# Patient Record
Sex: Female | Born: 1945 | Race: White | Hispanic: No | Smoking: Former smoker
Health system: Southern US, Community
[De-identification: ages and names within clinical notes are randomized; demographics above are authoritative.]

## PROBLEM LIST (undated history)

## (undated) DIAGNOSIS — E119 Type 2 diabetes mellitus without complications: Secondary | ICD-10-CM

## (undated) DIAGNOSIS — K219 Gastro-esophageal reflux disease without esophagitis: Secondary | ICD-10-CM

## (undated) DIAGNOSIS — J449 Chronic obstructive pulmonary disease, unspecified: Secondary | ICD-10-CM

## (undated) DIAGNOSIS — IMO0001 Reserved for inherently not codable concepts without codable children: Secondary | ICD-10-CM

## (undated) DIAGNOSIS — I509 Heart failure, unspecified: Secondary | ICD-10-CM

## (undated) DIAGNOSIS — I639 Cerebral infarction, unspecified: Secondary | ICD-10-CM

## (undated) HISTORY — PX: TONSILLECTOMY: SUR1361

## (undated) HISTORY — PX: NASAL SINUS SURGERY: SHX719

## (undated) HISTORY — PX: BACK SURGERY: SHX140

---

## 2015-12-08 ENCOUNTER — Emergency Department: Payer: Medicare Other

## 2015-12-08 ENCOUNTER — Encounter: Payer: Self-pay | Admitting: Emergency Medicine

## 2015-12-08 ENCOUNTER — Emergency Department
Admission: EM | Admit: 2015-12-08 | Discharge: 2015-12-08 | Disposition: A | Discharge status: Home / Self Care | Payer: Medicare Other | Attending: Emergency Medicine | Admitting: Emergency Medicine

## 2015-12-08 DIAGNOSIS — Y9301 Activity, walking, marching and hiking: Secondary | ICD-10-CM | POA: Diagnosis not present

## 2015-12-08 DIAGNOSIS — Y9289 Other specified places as the place of occurrence of the external cause: Secondary | ICD-10-CM | POA: Diagnosis not present

## 2015-12-08 DIAGNOSIS — W010XXA Fall on same level from slipping, tripping and stumbling without subsequent striking against object, initial encounter: Secondary | ICD-10-CM | POA: Diagnosis not present

## 2015-12-08 DIAGNOSIS — S199XXA Unspecified injury of neck, initial encounter: Secondary | ICD-10-CM | POA: Diagnosis present

## 2015-12-08 DIAGNOSIS — Z880 Allergy status to penicillin: Secondary | ICD-10-CM | POA: Insufficient documentation

## 2015-12-08 DIAGNOSIS — E119 Type 2 diabetes mellitus without complications: Secondary | ICD-10-CM | POA: Insufficient documentation

## 2015-12-08 DIAGNOSIS — S335XXA Sprain of ligaments of lumbar spine, initial encounter: Secondary | ICD-10-CM | POA: Insufficient documentation

## 2015-12-08 DIAGNOSIS — F17200 Nicotine dependence, unspecified, uncomplicated: Secondary | ICD-10-CM | POA: Diagnosis not present

## 2015-12-08 DIAGNOSIS — S139XXA Sprain of joints and ligaments of unspecified parts of neck, initial encounter: Secondary | ICD-10-CM | POA: Diagnosis not present

## 2015-12-08 DIAGNOSIS — Y998 Other external cause status: Secondary | ICD-10-CM | POA: Diagnosis not present

## 2015-12-08 DIAGNOSIS — J441 Chronic obstructive pulmonary disease with (acute) exacerbation: Secondary | ICD-10-CM | POA: Insufficient documentation

## 2015-12-08 HISTORY — DX: Gastro-esophageal reflux disease without esophagitis: K21.9

## 2015-12-08 HISTORY — DX: Chronic obstructive pulmonary disease, unspecified: J44.9

## 2015-12-08 HISTORY — DX: Reserved for inherently not codable concepts without codable children: IMO0001

## 2015-12-08 HISTORY — DX: Type 2 diabetes mellitus without complications: E11.9

## 2015-12-08 HISTORY — DX: Cerebral infarction, unspecified: I63.9

## 2015-12-08 MED ORDER — IPRATROPIUM-ALBUTEROL 0.5-2.5 (3) MG/3ML IN SOLN
3.0000 mL | Freq: Once | RESPIRATORY_TRACT | Status: AC
  Administered 2015-12-08: 3 mL via RESPIRATORY_TRACT

## 2015-12-08 MED ORDER — IBUPROFEN 400 MG PO TABS: 400.0000 mg | ORAL_TABLET | Freq: Three times a day (TID) | ORAL | Status: DC | PRN

## 2015-12-08 MED ORDER — IPRATROPIUM-ALBUTEROL 0.5-2.5 (3) MG/3ML IN SOLN
RESPIRATORY_TRACT | Status: AC
  Filled 2015-12-08: qty 3

## 2015-12-08 NOTE — ED Notes (Signed)
Pt from [facility]

## 2015-12-08 NOTE — ED Notes (Signed)
Patient transported to CT 

## 2015-12-08 NOTE — ED Notes (Signed)
Pt had mechanical fall yesterday.  Was walking and had slipped on hand rail and fell on tailbone. Pt denies pain yesterday but today has had low back pain and neck pain. Hx back surgery.  Denies weakness or feeling like going to pass out. No LOC with fall. Pt remembers event.

## 2015-12-08 NOTE — ED Notes (Signed)
Returned from radiology. 

## 2015-12-08 NOTE — ED Provider Notes (Signed)
[facility] Emergency Department Provider Note  ____________________________________________    I have reviewed the triage vital signs and the nursing notes.   HISTORY  Chief Complaint Fall    HPI Kristen Cross is a 70 y.o. female who presents after a mechanical fall. Patient reports the fall was yesterday she reports she fell onto her backside. She denies dizziness or chest pain or palpitations. She was feeling well after the fall but today she has soreness in her lower back and neck. No neuro deficits. No abdominal pain, no hip pain or pelvic pain     Past Medical History  Diagnosis Date  . Stroke (HCC)   . COPD (chronic obstructive pulmonary disease) (HCC)   . Diabetes mellitus without complication (HCC)   . Reflux     There are no active problems to display for this patient.   Past Surgical History  Procedure Laterality Date  . Back surgery    . Tonsillectomy    . Nasal sinus surgery      No current outpatient prescriptions on file.  Allergies Penicillins and Codeine  History reviewed. No pertinent family history.  Social History Social History  Substance Use Topics  . Smoking status: Current Every Day Smoker  . Smokeless tobacco: None  . Alcohol Use: No    Review of Systems  Constitutional: Negative for fever. Eyes: Negative for redness ENT: Negative for sore throat Cardiovascular: Negative for chest pain Respiratory: Mild cough Gastrointestinal: Negative for abdominal pain Genitourinary: Negative for dysuria. Musculoskeletal: As above Skin: Negative for rash. Neurological: Negative for focal weakness Psychiatric: no anxiety    ____________________________________________   PHYSICAL EXAM:  VITAL SIGNS: ED Triage Vitals  Enc Vitals Group     BP 12/08/15 1550 129/69 mmHg     Pulse Rate 12/08/15 1550 97     Resp 12/08/15 1550 18     Temp 12/08/15 1550 98.1 F (36.7 C)     Temp Source 12/08/15 1550 Oral     SpO2  12/08/15 1550 95 %     Weight 12/08/15 1550 192 lb (87.091 kg)     Height 12/08/15 1550 5\' 2"  (1.575 m)     Head Cir --      Peak Flow --      Pain Score 12/08/15 1551 9     Pain Loc --      Pain Edu? --      Excl. in GC? --      Constitutional: Alert and oriented. Well appearing and in no distress.  Eyes: Conjunctivae are normal. No erythema or injection ENT   Head: Normocephalic and atraumatic.   Mouth/Throat: Mucous membranes are moist. Cardiovascular: Normal rate, regular rhythm. Normal and symmetric distal pulses are present in the upper extremities.  Respiratory: Normal respiratory effort without tachypnea nor retractions. Scattered wheezes Gastrointestinal: Soft and non-tender in all quadrants. No distention. There is no CVA tenderness. Genitourinary: deferred Musculoskeletal: Nontender with normal range of motion in all extremities. No lower extremity tenderness nor edema. No vertebral tenderness to palpation, normal strength in the lower extremities and upper extremity is. No pain with hip range of motion bilaterally Neurologic:  Normal speech and language. No gross focal neurologic deficits are appreciated. Skin:  Skin is warm, dry and intact. No rash noted. Psychiatric: Mood and affect are normal. Patient exhibits appropriate insight and judgment.  ____________________________________________    LABS (pertinent positives/negatives)  Labs Reviewed - No data to display  ____________________________________________   EKG  None  ____________________________________________    RADIOLOGY  CT cervical spine is unremarkable, x-rays of the sacrum coccyx and lumbar spine show no acute abnormalities ____________________________________________   PROCEDURES  Procedure(s) performed: none  Critical Care performed: none  ____________________________________________   INITIAL IMPRESSION / ASSESSMENT AND PLAN / ED COURSE  Pertinent labs & imaging results  that were available during my care of the patient were reviewed by me and considered in my medical decision making (see chart for details).  No acute abdomen on his own imaging, exam is reassuring. Feel patient is appropriate for discharge at this time with outpatient follow-up as needed.  ____________________________________________   FINAL CLINICAL IMPRESSION(S) / ED DIAGNOSES  Final diagnoses:  Neck sprain, initial encounter  Lumbar sprain, initial encounter          Jene Every, MD 12/08/15 1945

## 2015-12-08 NOTE — ED Notes (Signed)
tammy from [facility] called and pt has no family. Can call tammy if pt needs ride [phone]. If unable to reach tammy can call janice [phone]

## 2015-12-14 ENCOUNTER — Emergency Department
Admission: EM | Admit: 2015-12-14 | Discharge: 2015-12-14 | Disposition: A | Discharge status: Skilled Nursing Facility | Payer: Medicare Other | Attending: Emergency Medicine | Admitting: Emergency Medicine

## 2015-12-14 ENCOUNTER — Emergency Department: Payer: Medicare Other

## 2015-12-14 ENCOUNTER — Encounter: Payer: Self-pay | Admitting: Intensive Care

## 2015-12-14 DIAGNOSIS — I639 Cerebral infarction, unspecified: Secondary | ICD-10-CM | POA: Diagnosis not present

## 2015-12-14 DIAGNOSIS — Z794 Long term (current) use of insulin: Secondary | ICD-10-CM | POA: Diagnosis not present

## 2015-12-14 DIAGNOSIS — Z79899 Other long term (current) drug therapy: Secondary | ICD-10-CM | POA: Insufficient documentation

## 2015-12-14 DIAGNOSIS — R0602 Shortness of breath: Secondary | ICD-10-CM | POA: Diagnosis present

## 2015-12-14 DIAGNOSIS — I509 Heart failure, unspecified: Secondary | ICD-10-CM | POA: Diagnosis not present

## 2015-12-14 DIAGNOSIS — F17200 Nicotine dependence, unspecified, uncomplicated: Secondary | ICD-10-CM | POA: Insufficient documentation

## 2015-12-14 DIAGNOSIS — J441 Chronic obstructive pulmonary disease with (acute) exacerbation: Secondary | ICD-10-CM | POA: Insufficient documentation

## 2015-12-14 DIAGNOSIS — E119 Type 2 diabetes mellitus without complications: Secondary | ICD-10-CM | POA: Insufficient documentation

## 2015-12-14 DIAGNOSIS — Z7982 Long term (current) use of aspirin: Secondary | ICD-10-CM | POA: Diagnosis not present

## 2015-12-14 HISTORY — DX: Heart failure, unspecified: I50.9

## 2015-12-14 LAB — CBC WITH DIFFERENTIAL/PLATELET
BASOS PCT: 0 %
Basophils Absolute: 0 10*3/uL (ref 0–0.1)
EOS ABS: 0 10*3/uL (ref 0–0.7)
Eosinophils Relative: 0 %
HEMATOCRIT: 33.4 % — AB (ref 35.0–47.0)
HEMOGLOBIN: 11.8 g/dL — AB (ref 12.0–16.0)
LYMPHS ABS: 0.9 10*3/uL — AB (ref 1.0–3.6)
Lymphocytes Relative: 7 %
MCH: 30.5 pg (ref 26.0–34.0)
MCHC: 35.3 g/dL (ref 32.0–36.0)
MCV: 86.4 fL (ref 80.0–100.0)
MONOS PCT: 6 %
Monocytes Absolute: 0.8 10*3/uL (ref 0.2–0.9)
NEUTROS ABS: 12.3 10*3/uL — AB (ref 1.4–6.5)
NEUTROS PCT: 87 %
Platelets: 217 10*3/uL (ref 150–440)
RBC: 3.86 MIL/uL (ref 3.80–5.20)
RDW: 13 % (ref 11.5–14.5)
WBC: 14.1 10*3/uL — AB (ref 3.6–11.0)

## 2015-12-14 LAB — BASIC METABOLIC PANEL
Anion gap: 8 (ref 5–15)
BUN: 14 mg/dL (ref 6–20)
CALCIUM: 8.7 mg/dL — AB (ref 8.9–10.3)
CHLORIDE: 100 mmol/L — AB (ref 101–111)
CO2: 29 mmol/L (ref 22–32)
CREATININE: 0.69 mg/dL (ref 0.44–1.00)
Glucose, Bld: 120 mg/dL — ABNORMAL HIGH (ref 65–99)
POTASSIUM: 2.7 mmol/L — AB (ref 3.5–5.1)
SODIUM: 137 mmol/L (ref 135–145)

## 2015-12-14 LAB — BRAIN NATRIURETIC PEPTIDE: B Natriuretic Peptide: 116 pg/mL — ABNORMAL HIGH (ref 0.0–100.0)

## 2015-12-14 LAB — TROPONIN I

## 2015-12-14 MED ORDER — POTASSIUM CHLORIDE CRYS ER 20 MEQ PO TBCR
40.0000 meq | EXTENDED_RELEASE_TABLET | Freq: Once | ORAL | Status: AC
  Administered 2015-12-14: 40 meq via ORAL
  Filled 2015-12-14: qty 2

## 2015-12-14 MED ORDER — IPRATROPIUM-ALBUTEROL 0.5-2.5 (3) MG/3ML IN SOLN
3.0000 mL | Freq: Once | RESPIRATORY_TRACT | Status: AC
  Administered 2015-12-14: 3 mL via RESPIRATORY_TRACT
  Filled 2015-12-14: qty 3

## 2015-12-14 MED ORDER — LEVOFLOXACIN 750 MG PO TABS
750.0000 mg | ORAL_TABLET | Freq: Once | ORAL | Status: AC
  Administered 2015-12-14: 750 mg via ORAL
  Filled 2015-12-14: qty 1

## 2015-12-14 MED ORDER — METHYLPREDNISOLONE SODIUM SUCC 125 MG IJ SOLR
125.0000 mg | Freq: Once | INTRAMUSCULAR | Status: AC
  Administered 2015-12-14: 125 mg via INTRAVENOUS
  Filled 2015-12-14: qty 2

## 2015-12-14 MED ORDER — LEVOFLOXACIN 500 MG PO TABS: 500.0000 mg | ORAL_TABLET | Freq: Every day | ORAL | Status: AC

## 2015-12-14 NOTE — ED Notes (Signed)
Spoke with [facility] in [place] about patient being discharged and being picked up. Worker stated they cannot pick up patient due to it being grocery day. It was explained to [facility] that EMS will not come pick up patient that is not bed bound. [facility] relayed to secretary they will be calling back about picking up patient

## 2015-12-14 NOTE — ED Notes (Signed)
Pt with increasing shortness of breath.  EMS was called to [name] where the patient is a resident.  EMS reports that SpO2 was 87% on RA [facility]  EMS placed CPAP on patient and O2 sat on CPAP was 97%.  On arrival to ED, Pt was on 4LO2 via n/c with O2 sats 98%.  EMS reports pt received nebulizers x2 at facility.

## 2015-12-14 NOTE — Discharge Instructions (Signed)
Chronic Obstructive Pulmonary Disease Chronic obstructive pulmonary disease (COPD) is a common lung condition in which airflow from the lungs is limited. COPD is a general term that can be used to describe many different lung problems that limit airflow, including both chronic bronchitis and emphysema. If you have COPD, your lung function will probably never return to normal, but there are measures you can take to improve lung function and make yourself feel better. CAUSES   Smoking (common).  Exposure to secondhand smoke.  Genetic problems.  Chronic inflammatory lung diseases or recurrent infections. SYMPTOMS  Shortness of breath, especially with physical activity.  Deep, persistent (chronic) cough with a large amount of thick mucus.  Wheezing.  Rapid breaths (tachypnea).  Gray or bluish discoloration (cyanosis) of the skin, especially in your fingers, toes, or lips.  Fatigue.  Weight loss.  Frequent infections or episodes when breathing symptoms become much worse (exacerbations).  Chest tightness. DIAGNOSIS Your health care provider will take a medical history and perform a physical examination to diagnose COPD. Additional tests for COPD may include:  Lung (pulmonary) function tests.  Chest X-ray.  CT scan.  Blood tests. TREATMENT  Treatment for COPD may include:  Inhaler and nebulizer medicines. These help manage the symptoms of COPD and make your breathing more comfortable.  Supplemental oxygen. Supplemental oxygen is only helpful if you have a low oxygen level in your blood.  Exercise and physical activity. These are beneficial for nearly all people with COPD.  Lung surgery or transplant.  Nutrition therapy to gain weight, if you are underweight.  Pulmonary rehabilitation. This may involve working with a team of health care providers and specialists, such as respiratory, occupational, and physical therapists. HOME CARE INSTRUCTIONS  Take all medicines  (inhaled or pills) as directed by your health care provider.  Avoid over-the-counter medicines or cough syrups that dry up your airway (such as antihistamines) and slow down the elimination of secretions unless instructed otherwise by your health care provider.  If you are a smoker, the most important thing that you can do is stop smoking. Continuing to smoke will cause further lung damage and breathing trouble. Ask your health care provider for help with quitting smoking. He or she can direct you to community resources or hospitals that provide support.  Avoid exposure to irritants such as smoke, chemicals, and fumes that aggravate your breathing.  Use oxygen therapy and pulmonary rehabilitation if directed by your health care provider. If you require home oxygen therapy, ask your health care provider whether you should purchase a pulse oximeter to measure your oxygen level at home.  Avoid contact with individuals who have a contagious illness.  Avoid extreme temperature and humidity changes.  Eat healthy foods. Eating smaller, more frequent meals and resting before meals may help you maintain your strength.  Stay active, but balance activity with periods of rest. Exercise and physical activity will help you maintain your ability to do things you want to do.  Preventing infection and hospitalization is very important when you have COPD. Make sure to receive all the vaccines your health care provider recommends, especially the pneumococcal and influenza vaccines. Ask your health care provider whether you need a pneumonia vaccine.  Learn and use relaxation techniques to manage stress.  Learn and use controlled breathing techniques as directed by your health care provider. Controlled breathing techniques include:  Pursed lip breathing. Start by breathing in (inhaling) through your nose for 1 second. Then, purse your lips as if you were   going to whistle and breathe out (exhale) through the  pursed lips for 2 seconds.  Diaphragmatic breathing. Start by putting one hand on your abdomen just above your waist. Inhale slowly through your nose. The hand on your abdomen should move out. Then purse your lips and exhale slowly. You should be able to feel the hand on your abdomen moving in as you exhale.  Learn and use controlled coughing to clear mucus from your lungs. Controlled coughing is a series of short, progressive coughs. The steps of controlled coughing are: 1. Lean your head slightly forward. 2. Breathe in deeply using diaphragmatic breathing. 3. Try to hold your breath for 3 seconds. 4. Keep your mouth slightly open while coughing twice. 5. Spit any mucus out into a tissue. 6. Rest and repeat the steps once or twice as needed. SEEK MEDICAL CARE IF:  You are coughing up more mucus than usual.  There is a change in the color or thickness of your mucus.  Your breathing is more labored than usual.  Your breathing is faster than usual. SEEK IMMEDIATE MEDICAL CARE IF:  You have shortness of breath while you are resting.  You have shortness of breath that prevents you from:  Being able to talk.  Performing your usual physical activities.  You have chest pain lasting longer than 5 minutes.  Your skin color is more cyanotic than usual.  You measure low oxygen saturations for longer than 5 minutes with a pulse oximeter. MAKE SURE YOU:  Understand these instructions.  Will watch your condition.  Will get help right away if you are not doing well or get worse.   This information is not intended to replace advice given to you by your health care provider. Make sure you discuss any questions you have with your health care provider.   Document Released: 06/06/2005 Document Revised: 09/17/2014 Document Reviewed: 04/23/2013 Elsevier Interactive Patient Education 2016 [employer]  

## 2015-12-14 NOTE — ED Notes (Signed)
Upon entering room pt appeared to be uncomfortable. When asked what was wrong pt stated "I feel better I just wish I could leave" asked if there was anything I could do to assist her to feel more comfortable pt stated "not really". Art therapistAmber RN notified about this.

## 2015-12-14 NOTE — ED Notes (Signed)
[facility] reported they will be here in a couple of hours to pick up patient. Patient is waiting in [facility] for ride

## 2015-12-14 NOTE — ED Provider Notes (Signed)
[facility] Emergency Department Provider Note     Time seen: ----------------------------------------- 11:00 AM on 12/14/2015 -----------------------------------------  L5 caveat: Review of systems and history is limited by dyspnea, CPAP   I have reviewed the triage vital signs and the nursing notes.   HISTORY  Chief Complaint No chief complaint on file.    HPI Kristen Cross is a 70 y.o. female who presents the ER being brought in by EMS for shortness of breath. She had significant difficulty breathing at her nursing home, was placed on CPAP and brought in for further evaluation. Patient feels like the oxygen as helped some. Review of systems and history is limited by shortness of breath   Past Medical History  Diagnosis Date  . Stroke (HCC)   . COPD (chronic obstructive pulmonary disease) (HCC)   . Diabetes mellitus without complication (HCC)   . Reflux     There are no active problems to display for this patient.   Past Surgical History  Procedure Laterality Date  . Back surgery    . Tonsillectomy    . Nasal sinus surgery      Allergies Penicillins and Codeine  Social History Social History  Substance Use Topics  . Smoking status: Current Every Day Smoker  . Smokeless tobacco: Not on file  . Alcohol Use: No    Review of Systems Constitutional: Negative for fever. Cardiovascular: Positive for shortness of breath and cough Respiratory: Negative for shortness of breath. Gastrointestinal: Negative for abdominal pain, vomiting and diarrhea.  Review of systems is otherwise negative or unknown at this time ____________________________________________   PHYSICAL EXAM:  VITAL SIGNS: ED Triage Vitals  Enc Vitals Group     BP --      Pulse --      Resp --      Temp --      Temp src --      SpO2 --      Weight --      Height --      Head Cir --      Peak Flow --      Pain Score --      Pain Loc --      Pain Edu? --    Excl. in GC? --     Constitutional: Alert and oriented. Mild to moderate distress Eyes: Conjunctivae are normal. PERRL. Normal extraocular movements. ENT   Head: Normocephalic and atraumatic.   Nose: No congestion/rhinnorhea.   Mouth/Throat: Mucous membranes are moist.   Neck: No stridor. Cardiovascular: Rapid rate, regular rhythm. Normal and symmetric distal pulses are present in all extremities. No murmurs, rubs, or gallops. Respiratory: Tachypnea with bilateral rhonchi and bibasilar rales Gastrointestinal: Soft and nontender. No distention. No abdominal bruits.  Musculoskeletal: Nontender with normal range of motion in all extremities. Lower extremity edema, left greater than right Neurologic:  Normal speech and language. No gross focal neurologic deficits are appreciated.  Skin:  Skin is warm, dry and intact. Scattered contusions are noted on her legs Psychiatric: Mood and affect are normal. Speech and behavior are normal. Patient exhibits appropriate insight and judgment. ____________________________________________  EKG: Interpreted by me. Normal sinus rhythm with rate 89 bpm, normal PR interval, normal QRS, normal QT interval. Leftward axis.  ____________________________________________  ED COURSE:  Pertinent labs & imaging results that were available during my care of the patient were reviewed by me and considered in my medical decision making (see chart for details). Patient presents with acute restaurant distress,  likely COPD with associated CHF. We will attempt to wean her off CPAP, given 2 nebs, steroids and reevaluate. ____________________________________________    LABS (pertinent positives/negatives)  Labs Reviewed  CBC WITH DIFFERENTIAL/PLATELET - Abnormal; Notable for the following:    WBC 14.1 (*)    Hemoglobin 11.8 (*)    HCT 33.4 (*)    Neutro Abs 12.3 (*)    Lymphs Abs 0.9 (*)    All other components within normal limits  BASIC METABOLIC PANEL  - Abnormal; Notable for the following:    Potassium 2.7 (*)    Chloride 100 (*)    Glucose, Bld 120 (*)    Calcium 8.7 (*)    All other components within normal limits  BRAIN NATRIURETIC PEPTIDE - Abnormal; Notable for the following:    B Natriuretic Peptide 116.0 (*)    All other components within normal limits  TROPONIN I   CRITICAL CARE Performed by: Emily FilbertWilliams, Lawana Hartzell E   Total critical care time: 30 minutes  Critical care time was exclusive of separately billable procedures and treating other patients.  Critical care was necessary to treat or prevent imminent or life-threatening deterioration.  Critical care was time spent personally by me on the following activities: development of treatment plan with patient and/or surrogate as well as nursing, discussions with consultants, evaluation of patient's response to treatment, examination of patient, obtaining history from patient or surrogate, ordering and performing treatments and interventions, ordering and review of laboratory studies, ordering and review of radiographic studies, pulse oximetry and re-evaluation of patient's condition.  RADIOLOGY  Chest x-ray Is unremarkable ____________________________________________  FINAL ASSESSMENT AND PLAN  COPD exacerbation, mild hypokalemia  Plan: Patient with labs and imaging as dictated above. Patient is improved, this is likely COPD exacerbation. There does not appear to be a significant CHF component. Patient's room air oxygen saturation saturations are 92%. She states she feels well enough to go home. She has been on steroids as prescribed by her doctor which is likely the reason she had an elevated white blood cell count. I will add Levaquin. She is stable for outpatient follow-up. Emily FilbertWilliams, Lalitha Ilyas E, MD   Emily FilbertJonathan E Royalty Fakhouri, MD 12/14/15 1255

## 2016-02-10 ENCOUNTER — Encounter: Payer: Self-pay | Admitting: Emergency Medicine

## 2016-02-10 ENCOUNTER — Emergency Department: Payer: Medicare Other

## 2016-02-10 ENCOUNTER — Emergency Department
Admission: EM | Admit: 2016-02-10 | Discharge: 2016-02-11 | Disposition: A | Discharge status: Home / Self Care | Payer: Medicare Other | Attending: Emergency Medicine | Admitting: Emergency Medicine

## 2016-02-10 DIAGNOSIS — Z8673 Personal history of transient ischemic attack (TIA), and cerebral infarction without residual deficits: Secondary | ICD-10-CM | POA: Diagnosis not present

## 2016-02-10 DIAGNOSIS — Z794 Long term (current) use of insulin: Secondary | ICD-10-CM | POA: Diagnosis not present

## 2016-02-10 DIAGNOSIS — F315 Bipolar disorder, current episode depressed, severe, with psychotic features: Secondary | ICD-10-CM | POA: Diagnosis not present

## 2016-02-10 DIAGNOSIS — E119 Type 2 diabetes mellitus without complications: Secondary | ICD-10-CM

## 2016-02-10 DIAGNOSIS — F3130 Bipolar disorder, current episode depressed, mild or moderate severity, unspecified: Secondary | ICD-10-CM

## 2016-02-10 DIAGNOSIS — J449 Chronic obstructive pulmonary disease, unspecified: Secondary | ICD-10-CM

## 2016-02-10 DIAGNOSIS — Z7982 Long term (current) use of aspirin: Secondary | ICD-10-CM | POA: Diagnosis not present

## 2016-02-10 DIAGNOSIS — Z87891 Personal history of nicotine dependence: Secondary | ICD-10-CM | POA: Insufficient documentation

## 2016-02-10 DIAGNOSIS — F314 Bipolar disorder, current episode depressed, severe, without psychotic features: Secondary | ICD-10-CM

## 2016-02-10 DIAGNOSIS — R45851 Suicidal ideations: Secondary | ICD-10-CM | POA: Diagnosis present

## 2016-02-10 DIAGNOSIS — Z79899 Other long term (current) drug therapy: Secondary | ICD-10-CM | POA: Insufficient documentation

## 2016-02-10 DIAGNOSIS — I509 Heart failure, unspecified: Secondary | ICD-10-CM | POA: Insufficient documentation

## 2016-02-10 LAB — COMPREHENSIVE METABOLIC PANEL
ALBUMIN: 4.1 g/dL (ref 3.5–5.0)
ALK PHOS: 103 U/L (ref 38–126)
ALT: 18 U/L (ref 14–54)
ANION GAP: 8 (ref 5–15)
AST: 26 U/L (ref 15–41)
BILIRUBIN TOTAL: 0.4 mg/dL (ref 0.3–1.2)
BUN: 14 mg/dL (ref 6–20)
CALCIUM: 9.8 mg/dL (ref 8.9–10.3)
CO2: 24 mmol/L (ref 22–32)
CREATININE: 0.75 mg/dL (ref 0.44–1.00)
Chloride: 104 mmol/L (ref 101–111)
GFR calc non Af Amer: >60 mL/min (ref >60)
GLUCOSE: 248 mg/dL — AB (ref 65–99)
Potassium: 4.5 mmol/L (ref 3.5–5.1)
SODIUM: 136 mmol/L (ref 135–145)
TOTAL PROTEIN: 6.9 g/dL (ref 6.5–8.1)

## 2016-02-10 LAB — CBC
HEMATOCRIT: 39.9 % (ref 35.0–47.0)
HEMOGLOBIN: 13.5 g/dL (ref 12.0–16.0)
MCH: 29.9 pg (ref 26.0–34.0)
MCHC: 33.9 g/dL (ref 32.0–36.0)
MCV: 88.2 fL (ref 80.0–100.0)
Platelets: 189 10*3/uL (ref 150–440)
RBC: 4.52 MIL/uL (ref 3.80–5.20)
RDW: 14.3 % (ref 11.5–14.5)
WBC: 9.5 10*3/uL (ref 3.6–11.0)

## 2016-02-10 LAB — SALICYLATE LEVEL: Salicylate Lvl: <4 mg/dL (ref 2.8–30.0)

## 2016-02-10 LAB — GLUCOSE, CAPILLARY: GLUCOSE-CAPILLARY: 336 mg/dL — AB (ref 65–99)

## 2016-02-10 LAB — ETHANOL: Alcohol, Ethyl (B): <5 mg/dL (ref <5)

## 2016-02-10 LAB — ACETAMINOPHEN LEVEL: Acetaminophen (Tylenol), Serum: <10 ug/mL — ABNORMAL LOW (ref 10–30)

## 2016-02-10 MED ORDER — POLYVINYL ALCOHOL 1.4 % OP SOLN
1.0000 [drp] | Freq: Three times a day (TID) | OPHTHALMIC | Status: DC
  Administered 2016-02-10 – 2016-02-11 (×2): 1 [drp] via OPHTHALMIC
  Filled 2016-02-10: qty 15

## 2016-02-10 MED ORDER — LISINOPRIL 5 MG PO TABS
5.0000 mg | ORAL_TABLET | Freq: Every day | ORAL | Status: DC
  Administered 2016-02-11: 5 mg via ORAL
  Filled 2016-02-10 (×2): qty 1

## 2016-02-10 MED ORDER — POTASSIUM CHLORIDE CRYS ER 20 MEQ PO TBCR
20.0000 meq | EXTENDED_RELEASE_TABLET | Freq: Two times a day (BID) | ORAL | Status: DC
  Administered 2016-02-10: 20 meq via ORAL
  Filled 2016-02-10 (×3): qty 1

## 2016-02-10 MED ORDER — ONDANSETRON HCL 4 MG PO TABS: 4.0000 mg | ORAL_TABLET | Freq: Every day | ORAL | Status: DC | PRN

## 2016-02-10 MED ORDER — INSULIN ASPART 100 UNIT/ML ~~LOC~~ SOLN
10.0000 [IU] | Freq: Three times a day (TID) | SUBCUTANEOUS | Status: DC
  Administered 2016-02-10 – 2016-02-11 (×2): 10 [IU] via SUBCUTANEOUS
  Filled 2016-02-10 (×2): qty 10
  Filled 2016-02-10: qty 5

## 2016-02-10 MED ORDER — FUROSEMIDE 40 MG PO TABS
40.0000 mg | ORAL_TABLET | Freq: Every day | ORAL | Status: DC | PRN
  Filled 2016-02-10: qty 1

## 2016-02-10 MED ORDER — PANTOPRAZOLE SODIUM 40 MG PO TBEC
40.0000 mg | DELAYED_RELEASE_TABLET | Freq: Every day | ORAL | Status: DC
Start: 2016-02-10 — End: 2016-02-11
  Administered 2016-02-11: 40 mg via ORAL
  Filled 2016-02-10 (×2): qty 1

## 2016-02-10 MED ORDER — HYDROXYCHLOROQUINE SULFATE 200 MG PO TABS
200.0000 mg | ORAL_TABLET | Freq: Two times a day (BID) | ORAL | Status: DC
  Administered 2016-02-10 – 2016-02-11 (×2): 200 mg via ORAL
  Filled 2016-02-10 (×5): qty 1

## 2016-02-10 MED ORDER — TRIAMCINOLONE ACETONIDE 0.1 % EX CREA
1.0000 "application " | TOPICAL_CREAM | Freq: Three times a day (TID) | CUTANEOUS | Status: DC
  Administered 2016-02-11: 1 via TOPICAL
  Filled 2016-02-10: qty 15

## 2016-02-10 MED ORDER — SIMVASTATIN 40 MG PO TABS
20.0000 mg | ORAL_TABLET | Freq: Every day | ORAL | Status: DC
  Filled 2016-02-10: qty 1

## 2016-02-10 MED ORDER — FOLIC ACID 1 MG PO TABS
1.0000 mg | ORAL_TABLET | Freq: Three times a day (TID) | ORAL | Status: DC
  Administered 2016-02-10 – 2016-02-11 (×3): 1 mg via ORAL
  Filled 2016-02-10 (×3): qty 1

## 2016-02-10 MED ORDER — SERTRALINE HCL 50 MG PO TABS
50.0000 mg | ORAL_TABLET | Freq: Every day | ORAL | Status: DC
  Administered 2016-02-10 – 2016-02-11 (×2): 50 mg via ORAL
  Filled 2016-02-10 (×2): qty 1

## 2016-02-10 MED ORDER — DOCUSATE SODIUM 100 MG PO CAPS
100.0000 mg | ORAL_CAPSULE | Freq: Two times a day (BID) | ORAL | Status: DC
  Administered 2016-02-11: 100 mg via ORAL
  Filled 2016-02-10 (×2): qty 1

## 2016-02-10 MED ORDER — INSULIN DETEMIR 100 UNIT/ML ~~LOC~~ SOLN
30.0000 [IU] | Freq: Every day | SUBCUTANEOUS | Status: DC
  Filled 2016-02-10 (×2): qty 0.3

## 2016-02-10 MED ORDER — LORATADINE 10 MG PO TABS
10.0000 mg | ORAL_TABLET | Freq: Every day | ORAL | Status: DC
Start: 2016-02-10 — End: 2016-02-11
  Administered 2016-02-11: 10 mg via ORAL
  Filled 2016-02-10 (×2): qty 1

## 2016-02-10 MED ORDER — CALCIUM CARBONATE ANTACID 500 MG PO CHEW
1.0000 | CHEWABLE_TABLET | Freq: Every day | ORAL | Status: DC
  Administered 2016-02-10 – 2016-02-11 (×2): 200 mg via ORAL
  Filled 2016-02-10 (×2): qty 1

## 2016-02-10 MED ORDER — POLYETHYLENE GLYCOL 3350 17 G PO PACK
17.0000 g | PACK | Freq: Every day | ORAL | Status: DC
  Administered 2016-02-11: 17 g via ORAL
  Filled 2016-02-10 (×2): qty 1

## 2016-02-10 MED ORDER — POLYETHYL GLYCOL-PROPYL GLYCOL 0.4-0.3 % OP SOLN
1.0000 [drp] | Freq: Three times a day (TID) | OPHTHALMIC | Status: DC
  Filled 2016-02-10 (×2): qty 1

## 2016-02-10 MED ORDER — VITAMIN D (ERGOCALCIFEROL) 1.25 MG (50000 UNIT) PO CAPS: 50000.0000 [IU] | ORAL_CAPSULE | ORAL | Status: DC

## 2016-02-10 MED ORDER — FERROUS SULFATE 325 (65 FE) MG PO TABS
325.0000 mg | ORAL_TABLET | Freq: Every day | ORAL | Status: DC
  Administered 2016-02-11: 325 mg via ORAL
  Filled 2016-02-10: qty 1

## 2016-02-10 MED ORDER — ALBUTEROL SULFATE HFA 108 (90 BASE) MCG/ACT IN AERS
2.0000 | INHALATION_SPRAY | RESPIRATORY_TRACT | Status: DC | PRN
  Filled 2016-02-10: qty 6.7

## 2016-02-10 MED ORDER — ASPIRIN 81 MG PO CHEW
81.0000 mg | CHEWABLE_TABLET | Freq: Every day | ORAL | Status: DC
  Administered 2016-02-10 – 2016-02-11 (×2): 81 mg via ORAL
  Filled 2016-02-10 (×2): qty 1

## 2016-02-10 MED ORDER — QUETIAPINE FUMARATE 25 MG PO TABS
100.0000 mg | ORAL_TABLET | Freq: Every day | ORAL | Status: DC
  Administered 2016-02-10: 100 mg via ORAL
  Filled 2016-02-10: qty 4
  Filled 2016-02-10: qty 1

## 2016-02-10 NOTE — Care Management Note (Signed)
Case Management Note  Patient Details  Name: Kristen Cross MRN: [id number] Date of Birth: 1945/10/14  Subjective/Objective:  The pt. Gets PT, OT and nursing through [facility].                  Action/Plan:   Expected Discharge Date:                  Expected Discharge Plan:     In-House Referral:     Discharge planning Services     Post Acute Care Choice:    Choice offered to:     DME Arranged:    DME Agency:     HH Arranged:    HH Agency:     Status of Service:     Medicare Important Message Given:    Date Medicare IM Given:    Medicare IM give by:    Date Additional Medicare IM Given:    Additional Medicare Important Message give by:     If discussed at Long Length of Stay Meetings, dates discussed:    Additional Comments:  Berna BueCheryl Lunabella Badgett, RN 02/10/2016, 1:08 PM

## 2016-02-10 NOTE — Consult Note (Signed)
[place] Psychiatry Consult   Reason for Consult:  Consult for 70 year old woman who came to the emergency room because of worsening severe depression with suicidal thoughts Referring Physician:  Reita Cliche Patient Identification: Kristen Cross MRN:  [id number] Principal Diagnosis: Bipolar disorder current episode depressed [facility]) Diagnosis:   Patient Active Problem List   Diagnosis Date Noted  . Bipolar disorder current episode depressed ([place]) [F31.30] 02/10/2016  . Suicidal ideation [R45.851] 02/10/2016  . Diabetes mellitus without complication ([place]) [G40.1] 02/10/2016  . COPD (chronic obstructive pulmonary disease) ([place]) [J44.9] 02/10/2016    Total Time spent with patient: 1 hour  Subjective:   Kristen Cross is a 70 y.o. female patient admitted with "thoughts of suicide every day".  HPI:  Patient interviewed. Chart reviewed. Labs and vitals reviewed. Case discussed with ER physician and TTS. 70 year old woman who came in by EMS because of much worsening depression. She said that she's been very depressed for several weeks but the last week has been the worst. She feels like there is no hope for her. She says she has thoughts about killing herself every day. Feels constantly down and negative. Feels she has nothing to live for. She is eating well but she is not sleeping well. She endorses having vague visual hallucinations and feelings of paranoia although she has some insight into this. She was being treated by the hospital doctor at [facility] with Zoloft for several weeks without clear affect. When the dose was increased the patient felt she got worse. Dose was then discontinued a few days ago. She is not currently on any other psychiatric medicine. She does not like living in assisted living compared to her previous situation living independently doesn't report another specific stress.  Social history: Patient is living in [facility] for the last couple months. Previously she had been living  independently but apparently was evicted from her apartment. Patient claims that she has absolutely no family whatsoever to get in touch with.  Medical history: Patient has diabetes insulin-dependent. COPD. Listed as having congestive heart failure. She says she's had strokes in the past which would explain some of her slurred speech and weakness. No history of cancer. She says she has been compliant with her prescribed medicine.   Substance abuse history: Patient says she does not drink and does not abuse drugs and has never done so in her life. Past Psychiatric History: Patient indicates that she's been treated for depression in the past. It also sounds like she's had episodes in the past of antidepressants making her manic. She says she was once treated with Elavil which made her "too high". She can remember some other medicines including lithium that she says she didn't tolerate because of side effects. Says that she's never been in a psychiatric hospital and has never actually made an attempt to kill her self.  Risk to Self: Suicidal Ideation: Yes-Currently Present Suicidal Intent: No Is patient at risk for suicide?: Yes Suicidal Plan?: No Access to Means: No What has been your use of drugs/alcohol within the last 12 months?: Reports of none How many times?: 0 Other Self Harm Risks: Reports of none Triggers for Past Attempts: None known Intentional Self Injurious Behavior: None Risk to Others: Homicidal Ideation: No Thoughts of Harm to Others: No Current Homicidal Intent: No Current Homicidal Plan: No Access to Homicidal Means: No Identified Victim: Reports of none History of harm to others?: No Assessment of Violence: None Noted Violent Behavior Description: Reports of none Does patient have  access to weapons?: No Criminal Charges Pending?: No Does patient have a court date: No Prior Inpatient Therapy: Prior Inpatient Therapy: No Prior Therapy Dates: Reports of none Prior  Therapy Facilty/Provider(s): Reports of none Reason for Treatment: Reports of none Prior Outpatient Therapy: Prior Outpatient Therapy: Yes Prior Therapy Dates: "I was in my 64's ([id number])" Prior Therapy Facilty/Provider(s): "I can't remember her name" Reason for Treatment: Anxiety & Depression Does patient have an ACCT team?: No Does patient have Intensive In-House Services?  : No Does patient have [facility] services? : No Does patient have P4CC services?: No  Past Medical History:  Past Medical History  Diagnosis Date  . Stroke ([place])   . COPD (chronic obstructive pulmonary disease) ([place])   . Diabetes mellitus without complication ([place])   . Reflux   . CHF (congestive heart failure) [facility])     Past Surgical History  Procedure Laterality Date  . Back surgery    . Tonsillectomy    . Nasal sinus surgery     Family History: History reviewed. No pertinent family history. Family Psychiatric  History: Patient says that her father had bipolar disorder. Doesn't know of any other family history Social History:  History  Alcohol Use No     History  Drug Use No    Social History   Social History  . Marital Status: Unknown    Spouse Name: N/A  . Number of Children: N/A  . Years of Education: N/A   Social History Main Topics  . Smoking status: Former Research scientist (life sciences)  . Smokeless tobacco: None  . Alcohol Use: No  . Drug Use: No  . Sexual Activity: Not Asked   Other Topics Concern  . None   Social History Narrative   Additional Social History:    Allergies:   Allergies  Allergen Reactions  . Penicillins Anaphylaxis  . Codeine Other (See Comments)    hallucination    Labs:  Results for orders placed or performed during the hospital encounter of 02/10/16 (from the past 48 hour(s))  Comprehensive metabolic panel     Status: Abnormal   Collection Time: 02/10/16 11:59 AM  Result Value Ref Range   Sodium 136 135 - 145 mmol/L   Potassium 4.5 3.5 - 5.1 mmol/L   Chloride 104 101 -  111 mmol/L   CO2 24 22 - 32 mmol/L   Glucose, Bld 248 (H) 65 - 99 mg/dL   BUN 14 6 - 20 mg/dL   Creatinine, Ser 0.75 0.44 - 1.00 mg/dL   Calcium 9.8 8.9 - 10.3 mg/dL   Total Protein 6.9 6.5 - 8.1 g/dL   Albumin 4.1 3.5 - 5.0 g/dL   AST 26 15 - 41 U/L   ALT 18 14 - 54 U/L   Alkaline Phosphatase 103 38 - 126 U/L   Total Bilirubin 0.4 0.3 - 1.2 mg/dL   GFR calc non Af Amer >60 >60 mL/min   GFR calc Af Amer >60 >60 mL/min    Comment: (NOTE) The eGFR has been calculated using the CKD EPI equation. This calculation has not been validated in all clinical situations. eGFR's persistently <60 mL/min signify possible Chronic Kidney Disease.    Anion gap 8 5 - 15  Ethanol     Status: None   Collection Time: 02/10/16 11:59 AM  Result Value Ref Range   Alcohol, Ethyl (B) <5 <5 mg/dL    Comment:        LOWEST DETECTABLE LIMIT FOR SERUM ALCOHOL IS 5 mg/dL FOR  MEDICAL PURPOSES ONLY   Salicylate level     Status: None   Collection Time: 02/10/16 11:59 AM  Result Value Ref Range   Salicylate Lvl <1.6 2.8 - 30.0 mg/dL  Acetaminophen level     Status: Abnormal   Collection Time: 02/10/16 11:59 AM  Result Value Ref Range   Acetaminophen (Tylenol), Serum <10 (L) 10 - 30 ug/mL    Comment:        THERAPEUTIC CONCENTRATIONS VARY SIGNIFICANTLY. A RANGE OF 10-30 ug/mL MAY BE AN EFFECTIVE CONCENTRATION FOR MANY PATIENTS. HOWEVER, SOME ARE BEST TREATED AT CONCENTRATIONS OUTSIDE THIS RANGE. ACETAMINOPHEN CONCENTRATIONS >150 ug/mL AT 4 HOURS AFTER INGESTION AND >50 ug/mL AT 12 HOURS AFTER INGESTION ARE OFTEN ASSOCIATED WITH TOXIC REACTIONS.   cbc     Status: None   Collection Time: 02/10/16 11:59 AM  Result Value Ref Range   WBC 9.5 3.6 - 11.0 K/uL   RBC 4.52 3.80 - 5.20 MIL/uL   Hemoglobin 13.5 12.0 - 16.0 g/dL   HCT 39.9 35.0 - 47.0 %   MCV 88.2 80.0 - 100.0 fL   MCH 29.9 26.0 - 34.0 pg   MCHC 33.9 32.0 - 36.0 g/dL   RDW 14.3 11.5 - 14.5 %   Platelets 189 150 - 440 K/uL     Current Facility-Administered Medications  Medication Dose Route Frequency Provider Last Rate Last Dose  . albuterol (PROVENTIL HFA;VENTOLIN HFA) 108 (90 Base) MCG/ACT inhaler 2 puff  2 puff Inhalation Q4H PRN Orbie Pyo, MD      . aspirin chewable tablet 81 mg  81 mg Oral Daily Orbie Pyo, MD      . calcium carbonate (TUMS - dosed in mg elemental calcium) chewable tablet 200 mg of elemental calcium  1 tablet Oral Daily Orbie Pyo, MD      . docusate sodium (COLACE) capsule 100 mg  100 mg Oral BID Orbie Pyo, MD      . Derrill Memo ON 02/11/2016] ferrous sulfate tablet 325 mg  325 mg Oral Q breakfast Orbie Pyo, MD      . folic acid (FOLVITE) tablet 1 mg  1 mg Oral TID Orbie Pyo, MD      . furosemide (LASIX) tablet 40 mg  40 mg Oral Daily PRN Orbie Pyo, MD      . hydroxychloroquine (PLAQUENIL) tablet 200 mg  200 mg Oral BID Orbie Pyo, MD      . insulin aspart (novoLOG) injection 10 Units  10 Units Subcutaneous TID WC Orbie Pyo, MD      . insulin detemir (LEVEMIR) injection 30 Units  30 Units Subcutaneous QHS Orbie Pyo, MD      . lisinopril (PRINIVIL,ZESTRIL) tablet 5 mg  5 mg Oral Daily Orbie Pyo, MD      . loratadine (CLARITIN) tablet 10 mg  10 mg Oral Daily Orbie Pyo, MD      . ondansetron [facility]) tablet 4 mg  4 mg Oral Daily PRN Orbie Pyo, MD      . pantoprazole (PROTONIX) EC tablet 40 mg  40 mg Oral Daily Orbie Pyo, MD      . polyethylene glycol (MIRALAX / Floria Raveling) packet 17 g  17 g Oral Daily Orbie Pyo, MD      . polyvinyl alcohol (LIQUIFILM TEARS) 1.4 % ophthalmic solution 1 drop  1 drop Both Eyes TID Orbie Pyo, MD      . potassium chloride  SA (K-DUR,KLOR-CON) CR tablet 20 mEq  20 mEq Oral BID Orbie Pyo, MD      . sertraline (ZOLOFT) tablet 50 mg  50 mg Oral  Daily Orbie Pyo, MD      . simvastatin (ZOCOR) tablet 20 mg  20 mg Oral q1800 Orbie Pyo, MD      . triamcinolone cream (KENALOG) 0.1 % 1 application  1 application Topical TID Orbie Pyo, MD      . Derrill Memo ON 02/13/2016] Vitamin D (Ergocalciferol) (DRISDOL) capsule 50,000 Units  50,000 Units Oral Q7 days Orbie Pyo, MD       Current Outpatient Prescriptions  Medication Sig Dispense Refill  . albuterol (PROVENTIL HFA;VENTOLIN HFA) 108 (90 Base) MCG/ACT inhaler Inhale 2 puffs into the lungs every 4 (four) hours as needed for wheezing or shortness of breath.    Marland Kitchen aspirin 81 MG chewable tablet Chew 81 mg by mouth daily.    . calcium carbonate (TUMS - DOSED IN MG ELEMENTAL CALCIUM) 500 MG chewable tablet Chew 1 tablet by mouth daily.    . cetirizine (ZYRTEC) 10 MG tablet Take 10 mg by mouth daily.    Marland Kitchen docusate sodium (COLACE) 100 MG capsule Take 100 mg by mouth 2 (two) times daily.    Marland Kitchen esomeprazole (NEXIUM) 40 MG capsule Take 40 mg by mouth daily at 12 noon.    . ferrous sulfate 325 (65 FE) MG tablet Take 325 mg by mouth daily with breakfast.    . folic acid (FOLVITE) 1 MG tablet Take 1 mg by mouth 3 (three) times daily.    . furosemide (LASIX) 40 MG tablet Take 40 mg by mouth daily as needed for fluid or edema. For swelling of extremities    . hydroxychloroquine (PLAQUENIL) 200 MG tablet Take 200 mg by mouth 2 (two) times daily.    Marland Kitchen ibuprofen (ADVIL,MOTRIN) 400 MG tablet Take 400 mg by mouth 2 (two) times daily as needed.    . insulin aspart (NOVOLOG FLEXPEN) 100 UNIT/ML FlexPen Inject 10 Units into the skin 3 (three) times daily with meals.    . insulin detemir (LEVEMIR) 100 UNIT/ML injection Inject 30 Units into the skin daily.    Marland Kitchen lisinopril (PRINIVIL,ZESTRIL) 5 MG tablet Take 5 mg by mouth daily.    Marland Kitchen omeprazole (PRILOSEC) 20 MG capsule Take 40 mg by mouth daily.    . ondansetron (ZOFRAN) 4 MG tablet Take 4 mg by mouth daily as needed for  nausea or vomiting.    Vladimir Faster Glycol-Propyl Glycol (SYSTANE) 0.4-0.3 % SOLN Place 1 drop into both eyes 3 (three) times daily.    . polyethylene glycol (MIRALAX / GLYCOLAX) packet Take 17 g by mouth daily.    . potassium chloride SA (K-DUR,KLOR-CON) 20 MEQ tablet Take 20 mEq by mouth 2 (two) times daily.    . sertraline (ZOLOFT) 50 MG tablet Take 50 mg by mouth daily.    . simvastatin (ZOCOR) 20 MG tablet Take 20 mg by mouth daily at 6 PM.    . triamcinolone cream (KENALOG) 0.1 % Apply 1 application topically 3 (three) times daily. Apply to bilateral elbow psoriasis plaques    . Vitamin D, Ergocalciferol, (DRISDOL) 50000 units CAPS capsule Take 50,000 Units by mouth every 7 (seven) days.      Musculoskeletal: Strength & Muscle Tone: decreased Gait & Station: unsteady Patient leans: Backward  Psychiatric Specialty Exam: Physical Exam  Nursing note and vitals reviewed. Constitutional: She appears well-developed and well-nourished.  HENT:  Head: Normocephalic and atraumatic.  Eyes: Conjunctivae are normal. Pupils are equal, round, and reactive to light.  Neck: Normal range of motion.  Cardiovascular: Normal rate and normal heart sounds.   Respiratory: Effort normal.  GI: Soft.  Musculoskeletal: Normal range of motion.  Neurological: She is alert.  Skin: Skin is warm and dry.  Psychiatric: Her speech is slurred. She is slowed. Cognition and memory are impaired. She expresses impulsivity. She exhibits a depressed mood. She expresses suicidal ideation. She expresses no suicidal plans. She exhibits abnormal remote memory.    Review of Systems  HENT: Negative.   Eyes: Negative.   Respiratory: Negative.   Cardiovascular: Negative.   Gastrointestinal: Negative.   Musculoskeletal: Positive for back pain.  Skin: Negative.   Neurological: Positive for weakness.  Psychiatric/Behavioral: Positive for depression, suicidal ideas and hallucinations. Negative for memory loss and substance  abuse. The patient is nervous/anxious and has insomnia.     Blood pressure 147/80, pulse 75, temperature 98 F (36.7 C), temperature source Oral, resp. rate 18, height 5' 2"  (1.575 m), weight 81.647 kg (180 lb), SpO2 98 %.Body mass index is 32.91 kg/(m^2).  General Appearance: Disheveled  Eye Contact:  Fair  Speech:  Slurred  Volume:  Decreased  Mood:  Depressed  Affect:  Depressed  Thought Process:  Descriptions of Associations: Tangential  Orientation:  Full (Time, Place, and Person)  Thought Content:  Hallucinations: Auditory Visual  Suicidal Thoughts:  Yes.  with intent/plan  Homicidal Thoughts:  No  Memory:  Immediate;   Good Recent;   Fair Remote;   Fair  Judgement:  Fair  Insight:  Good  Psychomotor Activity:  Decreased  Concentration:  Concentration: Fair  Recall:  [employer] of Knowledge:  Fair  Language:  Fair  Akathisia:  No  Handed:  Right  AIMS (if indicated):     Assets:  Communication Skills Desire for Improvement Financial Resources/Insurance Housing Resilience  ADL's:  Impaired  Cognition:  WNL  Sleep:        Treatment Plan Summary: Daily contact with patient to assess and evaluate symptoms and progress in treatment, Medication management and Plan 70 year old woman with a history that sounds consistent with bipolar disorder currently with agitated depression that was being possibly made worse by antidepressants. Active suicidal thoughts. Vague hallucinations and paranoia. Patient requires inpatient level psychiatric treatment. Her age and medical problems make her much more appropriate for a geropsychiatry unit. I will request the TTS look into referral to gero psych. Meanwhile she is on her usual outpatient medicines. 2 attempts starting her on a low-dose of Seroquel as a treatment for bipolar depression at night while we wait for gero psych bed.  Disposition: Recommend psychiatric Inpatient admission when medically cleared. Supportive therapy provided  about ongoing stressors.  Alethia Berthold, MD 02/10/2016 3:15 PM

## 2016-02-10 NOTE — ED Notes (Signed)
Pt placed in wine scrubs. Belongings secured.

## 2016-02-10 NOTE — ED Notes (Signed)
Patient came with phone number for Kristen Cross [phone] and for [name] [phone]

## 2016-02-10 NOTE — ED Notes (Signed)
Patient from [name] assisted living. [facility]  Pt has hx of anxiety and has been on Zoloft 25mg  to start. Over the past 2 weeks pt's dose was increased to 50mg  daily and for the past few nights pt states she hasn;t been able to sleep and has been trying to find ways to kill herself.  Pt states she has had this happen in the past with thoughts of harming herself.  Pt denies SI/HI on arrival to ED.

## 2016-02-10 NOTE — ED Provider Notes (Signed)
-----------------------------------------   7:37 PM on 02/10/2016 -----------------------------------------  The patient has been accepted to [facility].  She is still under involuntary commitment.  She will be transported by EMS because she cannot ride in a regular vehicle and law enforcement will be present.  ----------------------------------------- 11:24 PM on 02/10/2016 -----------------------------------------  [facility] that [facility] is no [facility] available [facility].  She will likely need to go in the morning.  Loleta Roseory [place], MD 02/10/16 2325
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
## 2016-02-10 NOTE — BH Assessment (Signed)
Referral information for Geriatric Placement have been faxed to;    [facility] [phone] or [phone] ext. [phone] Plater)   [facility] [phone]),    [facility] [phone] or [phone]),    [facility] [phone]),    [facility] [phone]),   [facility] [phone])   [facility] [phone]),    [place] [phone] or [phone]),    [facility] [phone]).

## 2016-02-10 NOTE — ED Provider Notes (Signed)
[facility] Emergency Department Provider Note   ____________________________________________  Time seen: Seen upon arrival to the emergency department  I have reviewed the triage vital signs and the nursing notes.   HISTORY  Chief Complaint Suicidal and Psychiatric Evaluation    HPI Kristen Cross is a 70 y.o. female with a history of diabetes as well as anxiety who had a recent increase in her dose of Zoloft from 25 g to 50 mg over the past 2 weeks is presenting with suicidal ideation and increased anxiety. She says that they were recently to residents of her skilled nursing facility that died. She said because of that she began feeling more anxious and went up in the middle the night trying to think the best way to kill herself. However, she has not come up with a specific plan.Denies any pain.   Past Medical History  Diagnosis Date  . Stroke (HCC)   . COPD (chronic obstructive pulmonary disease) (HCC)   . Diabetes mellitus without complication (HCC)   . Reflux   . CHF (congestive heart failure) (HCC)     There are no active problems to display for this patient.   Past Surgical History  Procedure Laterality Date  . Back surgery    . Tonsillectomy    . Nasal sinus surgery      Current Outpatient Rx  Name  Route  Sig  Dispense  Refill  . albuterol (PROVENTIL HFA;VENTOLIN HFA) 108 (90 Base) MCG/ACT inhaler   Inhalation   Inhale 2 puffs into the lungs every 4 (four) hours as needed for wheezing or shortness of breath.         . cetirizine (ZYRTEC) 10 MG tablet   Oral   Take 10 mg by mouth daily.         . furosemide (LASIX) 40 MG tablet   Oral   Take 40 mg by mouth daily.         . furosemide (LASIX) 40 MG tablet   Oral   Take 40 mg by mouth daily as needed. For swelling of extremities         . ibuprofen (ADVIL,MOTRIN) 400 MG tablet   Oral   Take 400 mg by mouth 2 (two) times daily as needed.         . insulin aspart  (NOVOLOG) 100 UNIT/ML injection   Subcutaneous   Inject 10 Units into the skin 3 (three) times daily before meals.         . insulin detemir (LEVEMIR) 100 UNIT/ML injection   Subcutaneous   Inject 30 Units into the skin daily.         Marland Kitchen ipratropium-albuterol (DUONEB) 0.5-2.5 (3) MG/3ML SOLN   Nebulization   Take 3 mLs by nebulization every 2 (two) hours as needed.         Marland Kitchen lisinopril (PRINIVIL,ZESTRIL) 5 MG tablet   Oral   Take 5 mg by mouth daily.         . mirabegron ER (MYRBETRIQ) 25 MG TB24 tablet   Oral   Take 25 mg by mouth daily.         . nitroGLYCERIN (NITROSTAT) 0.4 MG SL tablet   Sublingual   Place 0.4 mg under the tongue every 5 (five) minutes as needed for chest pain.         Marland Kitchen omeprazole (PRILOSEC) 20 MG capsule   Oral   Take 40 mg by mouth daily.         Marland Kitchen  ondansetron (ZOFRAN) 4 MG tablet   Oral   Take 4 mg by mouth daily as needed for nausea or vomiting.         . simvastatin (ZOCOR) 20 MG tablet   Oral   Take 20 mg by mouth daily at 6 PM.         . triamcinolone cream (KENALOG) 0.1 %   Topical   Apply 1 application topically 3 (three) times daily. Apply to bilateral elbow psoriasis plaques           Allergies Penicillins and Codeine  History reviewed. No pertinent family history.  Social History Social History  Substance Use Topics  . Smoking status: Former Games developermoker  . Smokeless tobacco: None  . Alcohol Use: No    Review of Systems Constitutional: No fever/chills Eyes: No visual changes. ENT: No sore throat. Cardiovascular: Denies chest pain. Respiratory: Denies shortness of breath. Gastrointestinal: No abdominal pain.  No nausea, no vomiting.  No diarrhea.  No constipation. Genitourinary: Negative for dysuria. Musculoskeletal: Negative for back pain. Skin: Negative for rash. Neurological: Negative for headaches, focal weakness or numbness.  10-point ROS otherwise  negative.  ____________________________________________   PHYSICAL EXAM:  VITAL SIGNS: ED Triage Vitals  Enc Vitals Group     BP 02/10/16 1149 154/86 mmHg     Pulse Rate 02/10/16 1149 87     Resp 02/10/16 1149 18     Temp 02/10/16 1149 98.2 F (36.8 C)     Temp Source 02/10/16 1149 Oral     SpO2 02/10/16 1149 98 %     Weight 02/10/16 1149 180 lb (81.647 kg)     Height 02/10/16 1149 5\' 2"  (1.575 m)     Head Cir --      Peak Flow --      Pain Score 02/10/16 1150 9     Pain Loc --      Pain Edu? --      Excl. in GC? --     Constitutional: Alert and oriented. Well appearing and in no acute distress. Eyes: Conjunctivae are normal. PERRL. EOMI. Head: Atraumatic. Nose: No congestion/rhinnorhea. Mouth/Throat: Mucous membranes are moist.   Neck: No stridor.   Cardiovascular: Normal rate, regular rhythm. Grossly normal heart sounds.  Good peripheral circulation. Respiratory: Normal respiratory effort.  No retractions. Lungs CTAB. Gastrointestinal: Soft and nontender. No distention.  Musculoskeletal: No lower extremity tenderness nor edema.  No joint effusions. Neurologic:  Normal speech and language. No gross focal neurologic deficits are appreciated. No gait instability. Skin:  Skin is warm, dry and intact. No rash noted. Psychiatric: Mood and affect are normal. Speech and behavior are normal.  ____________________________________________   LABS (all labs ordered are listed, but only abnormal results are displayed)  Labs Reviewed  COMPREHENSIVE METABOLIC PANEL  ETHANOL  SALICYLATE LEVEL  ACETAMINOPHEN LEVEL  CBC  URINE DRUG SCREEN, QUALITATIVE ([facility] ONLY)   ____________________________________________  EKG   ____________________________________________  RADIOLOGY   ____________________________________________   PROCEDURES   ____________________________________________   INITIAL IMPRESSION / ASSESSMENT AND PLAN / ED COURSE  Pertinent labs & imaging  results that were available during my care of the patient were reviewed by me and considered in my medical decision making (see chart for details).  Patient with suicidal ideation. She'll be involuntarily committed. She is aware of this. She also knows that she'll be speaking with the psychiatrist. ____________________________________________   FINAL CLINICAL IMPRESSION(S) / ED DIAGNOSES  Suicidal ideation.    NEW MEDICATIONS STARTED DURING THIS  VISIT:  New Prescriptions   No medications on file     Note:  This document was prepared using Dragon voice recognition software and may include unintentional dictation errors.    Myrna Blazer, MD 02/10/16 1210

## 2016-02-10 NOTE — BH Assessment (Signed)
[facility] been [facility].  [facility] Accepting physician is Dr. Tomma LightningSneed.  Call report to [phone].  Representative was [employer]isa.  ER Staff is aware of it Bonita Quin(Linda, ER Sect.; Dr. York CeriseForbach, ER MD & Marga HootsShannin, Patient's Nurse)    [facility] Liborio Nixon(Janice Worth-[phone]) have been updated as well.

## 2016-02-10 NOTE — Care Management Note (Addendum)
Case Management Note  Patient Details  Name: Kristen Cross MRN: [id number] Date of Birth: February 13, 1946  Subjective/Objective:       Text from Abby with [facility], states they have the pt. As a client.             Action/Plan:   Expected Discharge Date:                  Expected Discharge Plan:     In-House Referral:     Discharge planning Services     Post Acute Care Choice:    Choice offered to:     DME Arranged:    DME Agency:     HH Arranged:    HH Agency:     Status of Service:     Medicare Important Message Given:    Date Medicare IM Given:    Medicare IM give by:    Date Additional Medicare IM Given:    Additional Medicare Important Message give by:     If discussed at Long Length of Stay Meetings, dates discussed:    Additional Comments:  Kristen BueCheryl Daysie Helf, RN 02/10/2016, 1:03 PM Phone contact [phone]

## 2016-02-11 DIAGNOSIS — F319 Bipolar disorder, unspecified: Secondary | ICD-10-CM | POA: Insufficient documentation

## 2016-02-11 DIAGNOSIS — F315 Bipolar disorder, current episode depressed, severe, with psychotic features: Secondary | ICD-10-CM | POA: Diagnosis not present

## 2016-02-11 LAB — GLUCOSE, CAPILLARY
GLUCOSE-CAPILLARY: 281 mg/dL — AB (ref 65–99)
GLUCOSE-CAPILLARY: 263 mg/dL — AB (ref 65–99)
GLUCOSE-CAPILLARY: 182 mg/dL — AB (ref 65–99)
GLUCOSE-CAPILLARY: 242 mg/dL — AB (ref 65–99)

## 2016-02-11 NOTE — BH Assessment (Signed)
[facility] ([phone]) and informed them the patient is still waiting on transportation. It will be latter evening before she get there. She stated it was okay.

## 2016-02-11 NOTE — ED Notes (Signed)
Pt has hx of CHF. Pt was lying flat when tachy in 120s. HOB raised. Pt currently 104-106 BPM

## 2016-02-11 NOTE — ED Notes (Signed)
MD Manson PasseyBrown informed of pt's tachycardia. MD brown ordered fluids and urine sample. Pt began to cough when drinking fluids. Fluids stopped. Informed oncoming RN Felicia.

## 2016-02-11 NOTE — ED Notes (Signed)
BEHAVIORAL HEALTH ROUNDING Patient sleeping: No. Patient alert and oriented: yes Behavior appropriate: Yes.  ; If no, describe:  Nutrition and fluids offered: Yes  Toileting and hygiene offered: Yes  Sitter present: yes Law enforcement present: Yes  c

## 2016-02-11 NOTE — Consult Note (Signed)
[facility] Consult   Reason for Consult:  Consult for 70 year old woman who came to the emergency room because of worsening severe depression with suicidal thoughts Referring Physician:  Reita Cliche Patient Identification: Kristen Cross MRN:  [id number] Principal Diagnosis: Bipolar disorder current episode depressed [facility]) Diagnosis:   Patient Active Problem List   Diagnosis Date Noted  . Bipolar disorder current episode depressed ([place]) [F31.30] 02/10/2016  . Suicidal ideation [R45.851] 02/10/2016  . Diabetes mellitus without complication ([place]) [Z66.0] 02/10/2016  . COPD (chronic obstructive pulmonary disease) ([place]) [J44.9] 02/10/2016    Total Time spent with patient: 1 hour  Subjective:   Kristen Cross is a 70 y.o. female patient admitted with "thoughts of suicide every day".  Follow-up for this 70 year old woman with severe depression. Patient continues to endorse severely depressed mood. Active suicidal wishes. Patient has little interest and little motivation. Barely opens her eyes. Answers most questions by nodding. She does eat a little bit of food.  HPI:  Patient interviewed. Chart reviewed. Labs and vitals reviewed. Case discussed with ER physician and TTS. 70 year old woman who came in by EMS because of much worsening depression. She said that she's been very depressed for several weeks but the last week has been the worst. She feels like there is no hope for her. She says she has thoughts about killing herself every day. Feels constantly down and negative. Feels she has nothing to live for. She is eating well but she is not sleeping well. She endorses having vague visual hallucinations and feelings of paranoia although she has some insight into this. She was being treated by the hospital doctor at [facility] with Zoloft for several weeks without clear affect. When the dose was increased the patient felt she got worse. Dose was then discontinued a few days ago. She is not currently  on any other psychiatric medicine. She does not like living in assisted living compared to her previous situation living independently doesn't report another specific stress.  Social history: Patient is living in [facility] for the last couple months. Previously she had been living independently but apparently was evicted from her apartment. Patient claims that she has absolutely no family whatsoever to get in touch with.  Medical history: Patient has diabetes insulin-dependent. COPD. Listed as having congestive heart failure. She says she's had strokes in the past which would explain some of her slurred speech and weakness. No history of cancer. She says she has been compliant with her prescribed medicine.   Substance abuse history: Patient says she does not drink and does not abuse drugs and has never done so in her life. Past Psychiatric History: Patient indicates that she's been treated for depression in the past. It also sounds like she's had episodes in the past of antidepressants making her manic. She says she was once treated with Elavil which made her "too high". She can remember some other medicines including lithium that she says she didn't tolerate because of side effects. Says that she's never been in a psychiatric hospital and has never actually made an attempt to kill her self.  Risk to Self: Suicidal Ideation: Yes-Currently Present Suicidal Intent: No Is patient at risk for suicide?: Yes Suicidal Plan?: No Access to Means: No What has been your use of drugs/alcohol within the last 12 months?: Reports of none How many times?: 0 Other Self Harm Risks: Reports of none Triggers for Past Attempts: None known Intentional Self Injurious Behavior: None Risk to Others: Homicidal Ideation: No Thoughts  of Harm to Others: No Current Homicidal Intent: No Current Homicidal Plan: No Access to Homicidal Means: No Identified Victim: Reports of none History of harm to others?: No Assessment  of Violence: None Noted Violent Behavior Description: Reports of none Does patient have access to weapons?: No Criminal Charges Pending?: No Does patient have a court date: No Prior Inpatient Therapy: Prior Inpatient Therapy: No Prior Therapy Dates: Reports of none Prior Therapy Facilty/Provider(s): Reports of none Reason for Treatment: Reports of none Prior Outpatient Therapy: Prior Outpatient Therapy: Yes Prior Therapy Dates: "I was in my 82's ([id number])" Prior Therapy Facilty/Provider(s): "I can't remember her name" Reason for Treatment: Anxiety & Depression Does patient have an ACCT team?: No Does patient have Intensive In-House Services?  : No Does patient have [facility] services? : No Does patient have P4CC services?: No  Past Medical History:  Past Medical History  Diagnosis Date  . Stroke ([place])   . COPD (chronic obstructive pulmonary disease) ([place])   . Diabetes mellitus without complication ([place])   . Reflux   . CHF (congestive heart failure) [facility])     Past Surgical History  Procedure Laterality Date  . Back surgery    . Tonsillectomy    . Nasal sinus surgery     Family History: History reviewed. No pertinent family history. Family Psychiatric  History: Patient says that her father had bipolar disorder. Doesn't know of any other family history Social History:  History  Alcohol Use No     History  Drug Use No    Social History   Social History  . Marital Status: Unknown    Spouse Name: N/A  . Number of Children: N/A  . Years of Education: N/A   Social History Main Topics  . Smoking status: Former Research scientist (life sciences)  . Smokeless tobacco: None  . Alcohol Use: No  . Drug Use: No  . Sexual Activity: Not Asked   Other Topics Concern  . None   Social History Narrative   Additional Social History:    Allergies:   Allergies  Allergen Reactions  . Penicillins Anaphylaxis  . Codeine Other (See Comments)    hallucination    Labs:  Results for orders placed or  performed during the hospital encounter of 02/10/16 (from the past 48 hour(s))  Comprehensive metabolic panel     Status: Abnormal   Collection Time: 02/10/16 11:59 AM  Result Value Ref Range   Sodium 136 135 - 145 mmol/L   Potassium 4.5 3.5 - 5.1 mmol/L   Chloride 104 101 - 111 mmol/L   CO2 24 22 - 32 mmol/L   Glucose, Bld 248 (H) 65 - 99 mg/dL   BUN 14 6 - 20 mg/dL   Creatinine, Ser 0.75 0.44 - 1.00 mg/dL   Calcium 9.8 8.9 - 10.3 mg/dL   Total Protein 6.9 6.5 - 8.1 g/dL   Albumin 4.1 3.5 - 5.0 g/dL   AST 26 15 - 41 U/L   ALT 18 14 - 54 U/L   Alkaline Phosphatase 103 38 - 126 U/L   Total Bilirubin 0.4 0.3 - 1.2 mg/dL   GFR calc non Af Amer >60 >60 mL/min   GFR calc Af Amer >60 >60 mL/min    Comment: (NOTE) The eGFR has been calculated using the CKD EPI equation. This calculation has not been validated in all clinical situations. eGFR's persistently <60 mL/min signify possible Chronic Kidney Disease.    Anion gap 8 5 - 15  Ethanol  Status: None   Collection Time: 02/10/16 11:59 AM  Result Value Ref Range   Alcohol, Ethyl (B) <5 <5 mg/dL    Comment:        LOWEST DETECTABLE LIMIT FOR SERUM ALCOHOL IS 5 mg/dL FOR MEDICAL PURPOSES ONLY   Salicylate level     Status: None   Collection Time: 02/10/16 11:59 AM  Result Value Ref Range   Salicylate Lvl <3.2 2.8 - 30.0 mg/dL  Acetaminophen level     Status: Abnormal   Collection Time: 02/10/16 11:59 AM  Result Value Ref Range   Acetaminophen (Tylenol), Serum <10 (L) 10 - 30 ug/mL    Comment:        THERAPEUTIC CONCENTRATIONS VARY SIGNIFICANTLY. A RANGE OF 10-30 ug/mL MAY BE AN EFFECTIVE CONCENTRATION FOR MANY PATIENTS. HOWEVER, SOME ARE BEST TREATED AT CONCENTRATIONS OUTSIDE THIS RANGE. ACETAMINOPHEN CONCENTRATIONS >150 ug/mL AT 4 HOURS AFTER INGESTION AND >50 ug/mL AT 12 HOURS AFTER INGESTION ARE OFTEN ASSOCIATED WITH TOXIC REACTIONS.   cbc     Status: None   Collection Time: 02/10/16 11:59 AM  Result Value Ref  Range   WBC 9.5 3.6 - 11.0 K/uL   RBC 4.52 3.80 - 5.20 MIL/uL   Hemoglobin 13.5 12.0 - 16.0 g/dL   HCT 39.9 35.0 - 47.0 %   MCV 88.2 80.0 - 100.0 fL   MCH 29.9 26.0 - 34.0 pg   MCHC 33.9 32.0 - 36.0 g/dL   RDW 14.3 11.5 - 14.5 %   Platelets 189 150 - 440 K/uL  Glucose, capillary     Status: Abnormal   Collection Time: 02/10/16  6:29 PM  Result Value Ref Range   Glucose-Capillary 336 (H) 65 - 99 mg/dL  Glucose, capillary     Status: Abnormal   Collection Time: 02/11/16  6:30 AM  Result Value Ref Range   Glucose-Capillary 281 (H) 65 - 99 mg/dL  Glucose, capillary     Status: Abnormal   Collection Time: 02/11/16  9:11 AM  Result Value Ref Range   Glucose-Capillary 263 (H) 65 - 99 mg/dL  Glucose, capillary     Status: Abnormal   Collection Time: 02/11/16 12:47 PM  Result Value Ref Range   Glucose-Capillary 182 (H) 65 - 99 mg/dL    Current Facility-Administered Medications  Medication Dose Route Frequency Provider Last Rate Last Dose  . albuterol (PROVENTIL HFA;VENTOLIN HFA) 108 (90 Base) MCG/ACT inhaler 2 puff  2 puff Inhalation Q4H PRN Orbie Pyo, MD      . aspirin chewable tablet 81 mg  81 mg Oral Daily Orbie Pyo, MD   81 mg at 02/11/16 1057  . calcium carbonate (TUMS - dosed in mg elemental calcium) chewable tablet 200 mg of elemental calcium  1 tablet Oral Daily Orbie Pyo, MD   200 mg of elemental calcium at 02/11/16 1059  . docusate sodium (COLACE) capsule 100 mg  100 mg Oral BID Orbie Pyo, MD   100 mg at 02/11/16 1056  . ferrous sulfate tablet 325 mg  325 mg Oral Q breakfast Orbie Pyo, MD   325 mg at 02/11/16 0912  . folic acid (FOLVITE) tablet 1 mg  1 mg Oral TID Orbie Pyo, MD   1 mg at 02/11/16 1057  . furosemide (LASIX) tablet 40 mg  40 mg Oral Daily PRN Orbie Pyo, MD      . hydroxychloroquine (PLAQUENIL) tablet 200 mg  200 mg Oral BID Orbie Pyo, MD  200 mg at  02/11/16 1059  . insulin aspart (novoLOG) injection 10 Units  10 Units Subcutaneous TID WC Orbie Pyo, MD   10 Units at 02/11/16 0913  . insulin detemir (LEVEMIR) injection 30 Units  30 Units Subcutaneous QHS Orbie Pyo, MD   30 Units at 02/10/16 2217  . lisinopril (PRINIVIL,ZESTRIL) tablet 5 mg  5 mg Oral Daily Orbie Pyo, MD   5 mg at 02/11/16 1055  . loratadine (CLARITIN) tablet 10 mg  10 mg Oral Daily Orbie Pyo, MD   10 mg at 02/11/16 1057  . ondansetron (ZOFRAN) tablet 4 mg  4 mg Oral Daily PRN Orbie Pyo, MD      . pantoprazole (PROTONIX) EC tablet 40 mg  40 mg Oral Daily Orbie Pyo, MD   40 mg at 02/11/16 1055  . polyethylene glycol (MIRALAX / GLYCOLAX) packet 17 g  17 g Oral Daily Orbie Pyo, MD   17 g at 02/11/16 1105  . polyvinyl alcohol (LIQUIFILM TEARS) 1.4 % ophthalmic solution 1 drop  1 drop Both Eyes TID Orbie Pyo, MD   1 drop at 02/11/16 1101  . potassium chloride SA (K-DUR,KLOR-CON) CR tablet 20 mEq  20 mEq Oral BID Orbie Pyo, MD   20 mEq at 02/10/16 1534  . QUEtiapine (SEROQUEL) tablet 100 mg  100 mg Oral QHS Gonzella Lex, MD   100 mg at 02/10/16 2227  . sertraline (ZOLOFT) tablet 50 mg  50 mg Oral Daily Orbie Pyo, MD   50 mg at 02/11/16 1055  . simvastatin (ZOCOR) tablet 20 mg  20 mg Oral q1800 Orbie Pyo, MD   20 mg at 02/10/16 2215  . triamcinolone cream (KENALOG) 0.1 % 1 application  1 application Topical TID Orbie Pyo, MD   1 application at 02/63/78 1101  . [START ON 02/13/2016] Vitamin D (Ergocalciferol) (DRISDOL) capsule 50,000 Units  50,000 Units Oral Q7 days Orbie Pyo, MD       Current Outpatient Prescriptions  Medication Sig Dispense Refill  . albuterol (PROVENTIL HFA;VENTOLIN HFA) 108 (90 Base) MCG/ACT inhaler Inhale 2 puffs into the lungs every 4 (four) hours as needed for wheezing or shortness of  breath.    Marland Kitchen aspirin 81 MG chewable tablet Chew 81 mg by mouth daily.    . calcium carbonate (TUMS - DOSED IN MG ELEMENTAL CALCIUM) 500 MG chewable tablet Chew 1 tablet by mouth daily.    . cetirizine (ZYRTEC) 10 MG tablet Take 10 mg by mouth daily.    Marland Kitchen docusate sodium (COLACE) 100 MG capsule Take 100 mg by mouth 2 (two) times daily.    Marland Kitchen esomeprazole (NEXIUM) 40 MG capsule Take 40 mg by mouth daily at 12 noon.    . ferrous sulfate 325 (65 FE) MG tablet Take 325 mg by mouth daily with breakfast.    . folic acid (FOLVITE) 1 MG tablet Take 1 mg by mouth 3 (three) times daily.    . furosemide (LASIX) 40 MG tablet Take 40 mg by mouth daily as needed for fluid or edema. For swelling of extremities    . hydroxychloroquine (PLAQUENIL) 200 MG tablet Take 200 mg by mouth 2 (two) times daily.    Marland Kitchen ibuprofen (ADVIL,MOTRIN) 400 MG tablet Take 400 mg by mouth 2 (two) times daily as needed.    . insulin aspart (NOVOLOG FLEXPEN) 100 UNIT/ML FlexPen Inject 10 Units into the skin 3 (three) times daily with  meals.    . insulin detemir (LEVEMIR) 100 UNIT/ML injection Inject 30 Units into the skin daily.    Marland Kitchen lisinopril (PRINIVIL,ZESTRIL) 5 MG tablet Take 5 mg by mouth daily.    . mirabegron ER (MYRBETRIQ) 25 MG TB24 tablet Take 25 mg by mouth daily.    Marland Kitchen omeprazole (PRILOSEC) 20 MG capsule Take 40 mg by mouth daily.    . ondansetron (ZOFRAN) 4 MG tablet Take 4 mg by mouth daily as needed for nausea or vomiting.    Vladimir Faster Glycol-Propyl Glycol (SYSTANE) 0.4-0.3 % SOLN Place 1 drop into both eyes 3 (three) times daily.    . polyethylene glycol (MIRALAX / GLYCOLAX) packet Take 17 g by mouth daily.    . potassium chloride SA (K-DUR,KLOR-CON) 20 MEQ tablet Take 20 mEq by mouth 2 (two) times daily.    . sertraline (ZOLOFT) 50 MG tablet Take 50 mg by mouth daily.    . simvastatin (ZOCOR) 20 MG tablet Take 20 mg by mouth daily at 6 PM.    . triamcinolone cream (KENALOG) 0.1 % Apply 1 application topically 3 (three)  times daily. Apply to bilateral elbow psoriasis plaques    . Vitamin D, Ergocalciferol, (DRISDOL) 50000 units CAPS capsule Take 50,000 Units by mouth every 7 (seven) days.      Musculoskeletal: Strength & Muscle Tone: decreased Gait & Station: unsteady Patient leans: Backward  Psychiatric Specialty Exam: Physical Exam  Nursing note and vitals reviewed. Constitutional: She appears well-developed and well-nourished.  HENT:  Head: Normocephalic and atraumatic.  Eyes: Conjunctivae are normal. Pupils are equal, round, and reactive to light.  Neck: Normal range of motion.  Cardiovascular: Normal rate and normal heart sounds.   Respiratory: Effort normal.  GI: Soft.  Musculoskeletal: Normal range of motion.  Neurological: She is alert.  Skin: Skin is warm and dry.  Psychiatric: Her speech is slurred. She is slowed. Cognition and memory are impaired. She expresses impulsivity. She exhibits a depressed mood. She expresses suicidal ideation. She expresses no suicidal plans. She exhibits abnormal remote memory.    Review of Systems  HENT: Negative.   Eyes: Negative.   Respiratory: Negative.   Cardiovascular: Negative.   Gastrointestinal: Negative.   Musculoskeletal: Positive for back pain.  Skin: Negative.   Neurological: Positive for weakness.  Psychiatric/Behavioral: Positive for depression, suicidal ideas and hallucinations. Negative for memory loss and substance abuse. The patient is nervous/anxious and has insomnia.     Blood pressure 123/90, pulse 100, temperature 98 F (36.7 C), temperature source Oral, resp. rate 18, height 5' 2" (1.575 m), weight 81.647 kg (180 lb), SpO2 97 %.Body mass index is 32.91 kg/(m^2).  General Appearance: Disheveled  Eye Contact:  Fair  Speech:  Slurred  Volume:  Decreased  Mood:  Depressed  Affect:  Depressed  Thought Process:  Descriptions of Associations: Tangential  Orientation:  Full (Time, Place, and Person)  Thought Content:   Hallucinations: Auditory Visual  Suicidal Thoughts:  Yes.  with intent/plan  Homicidal Thoughts:  No  Memory:  Immediate;   Good Recent;   Fair Remote;   Fair  Judgement:  Fair  Insight:  Good  Psychomotor Activity:  Decreased  Concentration:  Concentration: Fair  Recall:  [employer] of Knowledge:  Fair  Language:  Fair  Akathisia:  No  Handed:  Right  AIMS (if indicated):     Assets:  Communication Skills Desire for Improvement Financial Resources/Insurance Housing Resilience  ADL's:  Impaired  Cognition:  WNL  Sleep:        Treatment Plan Summary: We are continuing referral to a geriatric psychiatry unit. Supportive counseling and reassurance to the patient. Continue current medicines as prescribed. Case reviewed with TTS. Disposition: Recommend psychiatric Inpatient admission when medically cleared. Supportive therapy provided about ongoing stressors.  Alethia Berthold, MD 02/11/2016 5:50 PM

## 2016-03-18 ENCOUNTER — Encounter: Payer: Self-pay | Admitting: Emergency Medicine

## 2016-03-18 ENCOUNTER — Emergency Department: Payer: Medicare Other

## 2016-03-18 ENCOUNTER — Emergency Department
Admission: EM | Admit: 2016-03-18 | Discharge: 2016-03-18 | Payer: Medicare Other | Attending: Emergency Medicine | Admitting: Emergency Medicine

## 2016-03-18 DIAGNOSIS — I509 Heart failure, unspecified: Secondary | ICD-10-CM | POA: Diagnosis not present

## 2016-03-18 DIAGNOSIS — E119 Type 2 diabetes mellitus without complications: Secondary | ICD-10-CM | POA: Insufficient documentation

## 2016-03-18 DIAGNOSIS — Z794 Long term (current) use of insulin: Secondary | ICD-10-CM | POA: Diagnosis not present

## 2016-03-18 DIAGNOSIS — Z79899 Other long term (current) drug therapy: Secondary | ICD-10-CM | POA: Diagnosis not present

## 2016-03-18 DIAGNOSIS — F3130 Bipolar disorder, current episode depressed, mild or moderate severity, unspecified: Secondary | ICD-10-CM | POA: Insufficient documentation

## 2016-03-18 DIAGNOSIS — J449 Chronic obstructive pulmonary disease, unspecified: Secondary | ICD-10-CM | POA: Insufficient documentation

## 2016-03-18 DIAGNOSIS — M87044 Idiopathic aseptic necrosis of right finger(s): Secondary | ICD-10-CM | POA: Diagnosis not present

## 2016-03-18 DIAGNOSIS — Z7982 Long term (current) use of aspirin: Secondary | ICD-10-CM | POA: Diagnosis not present

## 2016-03-18 DIAGNOSIS — Z8673 Personal history of transient ischemic attack (TIA), and cerebral infarction without residual deficits: Secondary | ICD-10-CM | POA: Insufficient documentation

## 2016-03-18 DIAGNOSIS — I96 Gangrene, not elsewhere classified: Secondary | ICD-10-CM

## 2016-03-18 DIAGNOSIS — Z87891 Personal history of nicotine dependence: Secondary | ICD-10-CM | POA: Diagnosis not present

## 2016-03-18 DIAGNOSIS — M79644 Pain in right finger(s): Secondary | ICD-10-CM | POA: Diagnosis present

## 2016-03-18 LAB — CBC WITH DIFFERENTIAL/PLATELET
BASOS ABS: 0 10*3/uL (ref 0–0.1)
BASOS PCT: 0 %
Eosinophils Absolute: 0 10*3/uL (ref 0–0.7)
Eosinophils Relative: 0 %
HEMATOCRIT: 38.6 % (ref 35.0–47.0)
HEMOGLOBIN: 13.6 g/dL (ref 12.0–16.0)
Lymphocytes Relative: 13 %
Lymphs Abs: 1.7 10*3/uL (ref 1.0–3.6)
MCH: 30.2 pg (ref 26.0–34.0)
MCHC: 35.3 g/dL (ref 32.0–36.0)
MCV: 85.7 fL (ref 80.0–100.0)
MONOS PCT: 6 %
Monocytes Absolute: 0.8 10*3/uL (ref 0.2–0.9)
NEUTROS ABS: 10.5 10*3/uL — AB (ref 1.4–6.5)
NEUTROS PCT: 81 %
Platelets: 176 10*3/uL (ref 150–440)
RBC: 4.51 MIL/uL (ref 3.80–5.20)
RDW: 13.4 % (ref 11.5–14.5)
WBC: 13.1 10*3/uL — ABNORMAL HIGH (ref 3.6–11.0)

## 2016-03-18 LAB — BASIC METABOLIC PANEL
ANION GAP: 7 (ref 5–15)
BUN: 16 mg/dL (ref 6–20)
CALCIUM: 8.9 mg/dL (ref 8.9–10.3)
CO2: 27 mmol/L (ref 22–32)
Chloride: 102 mmol/L (ref 101–111)
Creatinine, Ser: 0.78 mg/dL (ref 0.44–1.00)
GFR calc non Af Amer: >60 mL/min (ref >60)
Glucose, Bld: 254 mg/dL — ABNORMAL HIGH (ref 65–99)
Potassium: 3 mmol/L — ABNORMAL LOW (ref 3.5–5.1)
Sodium: 136 mmol/L (ref 135–145)

## 2016-03-18 LAB — SEDIMENTATION RATE: Sed Rate: 39 mm/hr — ABNORMAL HIGH (ref 0–30)

## 2016-03-18 MED ORDER — PIPERACILLIN-TAZOBACTAM 3.375 G IVPB 30 MIN
3.3750 g | Freq: Once | INTRAVENOUS | Status: DC
  Filled 2016-03-18: qty 50

## 2016-03-18 MED ORDER — VANCOMYCIN HCL IN DEXTROSE 1-5 GM/200ML-% IV SOLN
1000.0000 mg | Freq: Once | INTRAVENOUS | Status: AC
  Administered 2016-03-18: 1000 mg via INTRAVENOUS
  Filled 2016-03-18: qty 200

## 2016-03-18 MED ORDER — KETOROLAC TROMETHAMINE 30 MG/ML IJ SOLN
15.0000 mg | Freq: Once | INTRAMUSCULAR | Status: DC
  Filled 2016-03-18: qty 1

## 2016-03-18 NOTE — ED Provider Notes (Signed)
Time Seen: Approximately *1250  I have reviewed the triage notes  Chief Complaint: Hand Pain and Wound Infection   History of Present Illness: Kristen Cross is a 70 y.o. female who presents with some pain and swelling in her right ring finger. Patient states that she had her hand caught in a door 2 days ago. She states she had increased pain and swelling and the redness has continued over the last 24-48 hours. The patient was sent over due to the redness and purulent drainage that seem to be occurring from her right finger. She did have a ring on that finger which was removed here in the emergency department. Patient denies any fever. She requests pain medication but does not want [place] to give her anything that'll "" make her drowsy "". She denies any other significant injury. Patient is right-handed   Past Medical History  Diagnosis Date  . Stroke (HCC)   . COPD (chronic obstructive pulmonary disease) (HCC)   . Diabetes mellitus without complication (HCC)   . Reflux   . CHF (congestive heart failure) [facility])     Patient Active Problem List   Diagnosis Date Noted  . Affective psychosis, bipolar (HCC)   . Bipolar disorder current episode depressed (HCC) 02/10/2016  . Suicidal ideation 02/10/2016  . Diabetes mellitus without complication (HCC) 02/10/2016  . COPD (chronic obstructive pulmonary disease) (HCC) 02/10/2016    Past Surgical History  Procedure Laterality Date  . Back surgery    . Tonsillectomy    . Nasal sinus surgery      Past Surgical History  Procedure Laterality Date  . Back surgery    . Tonsillectomy    . Nasal sinus surgery      Current Outpatient Rx  Name  Route  Sig  Dispense  Refill  . albuterol (PROVENTIL HFA;VENTOLIN HFA) 108 (90 Base) MCG/ACT inhaler   Inhalation   Inhale 2 puffs into the lungs every 4 (four) hours as needed for wheezing or shortness of breath.         Marland Kitchen aspirin 81 MG chewable tablet   Oral   Chew 81 mg by mouth daily.          . calcium carbonate (TUMS - DOSED IN MG ELEMENTAL CALCIUM) 500 MG chewable tablet   Oral   Chew 1 tablet by mouth daily.         . cetirizine (ZYRTEC) 10 MG tablet   Oral   Take 10 mg by mouth daily.         Marland Kitchen docusate sodium (COLACE) 100 MG capsule   Oral   Take 100 mg by mouth 2 (two) times daily.         Marland Kitchen esomeprazole (NEXIUM) 40 MG capsule   Oral   Take 40 mg by mouth daily at 12 noon.         . ferrous sulfate 325 (65 FE) MG tablet   Oral   Take 325 mg by mouth daily with breakfast.         . folic acid (FOLVITE) 1 MG tablet   Oral   Take 1 mg by mouth 3 (three) times daily.         . furosemide (LASIX) 40 MG tablet   Oral   Take 40 mg by mouth daily as needed for fluid or edema. For swelling of extremities         . hydroxychloroquine (PLAQUENIL) 200 MG tablet   Oral   Take 200 mg  by mouth 2 (two) times daily.         Marland Kitchen. ibuprofen (ADVIL,MOTRIN) 400 MG tablet   Oral   Take 400 mg by mouth 2 (two) times daily as needed.         . insulin aspart (NOVOLOG FLEXPEN) 100 UNIT/ML FlexPen   Subcutaneous   Inject 10 Units into the skin 3 (three) times daily with meals.         . insulin detemir (LEVEMIR) 100 UNIT/ML injection   Subcutaneous   Inject 30 Units into the skin daily.         Marland Kitchen. lisinopril (PRINIVIL,ZESTRIL) 5 MG tablet   Oral   Take 5 mg by mouth daily.         . mirabegron ER (MYRBETRIQ) 25 MG TB24 tablet   Oral   Take 25 mg by mouth daily.         Marland Kitchen. omeprazole (PRILOSEC) 20 MG capsule   Oral   Take 40 mg by mouth daily.         . ondansetron (ZOFRAN) 4 MG tablet   Oral   Take 4 mg by mouth daily as needed for nausea or vomiting.         Bertram Gala. Polyethyl Glycol-Propyl Glycol (SYSTANE) 0.4-0.3 % SOLN   Both Eyes   Place 1 drop into both eyes 3 (three) times daily.         . polyethylene glycol (MIRALAX / GLYCOLAX) packet   Oral   Take 17 g by mouth daily.         . potassium chloride SA (K-DUR,KLOR-CON) 20 MEQ  tablet   Oral   Take 20 mEq by mouth 2 (two) times daily.         . sertraline (ZOLOFT) 50 MG tablet   Oral   Take 50 mg by mouth daily.         . simvastatin (ZOCOR) 20 MG tablet   Oral   Take 20 mg by mouth daily at 6 PM.         . triamcinolone cream (KENALOG) 0.1 %   Topical   Apply 1 application topically 3 (three) times daily. Apply to bilateral elbow psoriasis plaques         . Vitamin D, Ergocalciferol, (DRISDOL) 50000 units CAPS capsule   Oral   Take 50,000 Units by mouth every 7 (seven) days.           Allergies:  Penicillins and Codeine  Family History: History reviewed. No pertinent family history.  Social History: Social History  Substance Use Topics  . Smoking status: Former Games developermoker  . Smokeless tobacco: None  . Alcohol Use: No     Review of Systems:   10 point review of systems was performed and was otherwise negative:  Constitutional: No fever Eyes: No visual disturbances ENT: No sore throat, ear pain Cardiac: No chest pain Respiratory: No shortness of breath, wheezing, or stridor Abdomen: No abdominal pain, no vomiting, No diarrhea Endocrine: No weight loss, No night sweats Extremities: No peripheral edema, cyanosis Skin: No rashes, easy bruising Neurologic: No focal weakness, trouble with speech or swollowing Urologic: No dysuria, Hematuria, or urinary frequency Patient states she normally gets around in a wheelchair most of time while at the assisted living area  Physical Exam:  ED Triage Vitals  Enc Vitals Group     BP 03/18/16 1118 138/84 mmHg     Pulse Rate 03/18/16 1118 110     Resp 03/18/16 1118  17     Temp 03/18/16 1118 98.3 F (36.8 C)     Temp Source 03/18/16 1118 Oral     SpO2 03/18/16 1118 95 %     Weight 03/18/16 1118 165 lb (74.844 kg)     Height 03/18/16 1118  (1.575 m)     Head Cir --      Peak Flow --      Pain Score 03/18/16 1053 8     Pain Loc --      Pain Edu? --      Excl. in GC? --      General: Awake , Alert , and Oriented times 3; GCS 15 Head: Normal cephalic , atraumatic Eyes: Pupils equal , round, reactive to light Nose/Throat: No nasal drainage, patent upper airway without erythema or exudate.  Neck: Supple, Full range of motion, No anterior adenopathy or palpable thyroid masses Lungs: Clear to ascultation without wheezes , rhonchi, or rales Heart: Regular rate, regular rhythm without murmurs , gallops , or rubs Abdomen: Soft, non tender without rebound, guarding , or rigidity; bowel sounds positive and symmetric in all 4 quadrants. No organomegaly .        Extremities: Close examination of the right ring finger shows extensive swelling and erythema and an inability to flex or extend her right finger with erythema extending into the posterior surface of her right hand. Patient has decreased sensation over the tip of her finger which is rather indurated . Greater than 2 second capillary refill. Very tender to light palpation. Neurologic: normal ambulation, Motor symmetric without deficits, sensory intact Skin: warm, dry, no rashes   Labs:   All laboratory work was reviewed including any pertinent negatives or positives listed below:  Labs Reviewed  BASIC METABOLIC PANEL - Abnormal; Notable for the following:    Potassium 3.0 (*)    Glucose, Bld 254 (*)    All other components within normal limits  CBC WITH DIFFERENTIAL/PLATELET - Abnormal; Notable for the following:    WBC 13.1 (*)    Neutro Abs 10.5 (*)    All other components within normal limits  SEDIMENTATION RATE - Abnormal; Notable for the following:    Sed Rate 39 (*)    All other components within normal limits  Radiology:      DG Hand Complete Right (Final result) Result time: 03/18/16 12:55:53   Final result by Rad Results In Interface (03/18/16 12:55:53)   Narrative:   CLINICAL DATA: Right hand injury 2 days ago  EXAM: RIGHT HAND - COMPLETE 3+ VIEW  COMPARISON: None.  FINDINGS: No  fracture, dislocation or suspicious focal osseous lesion. There is flattening and fragmentation of the lunate. Diffuse osteopenia. Erosive osteoarthritis in the distal interphalangeal joints of the second and fifth fingers. Osteoarthritis in the first carpometacarpal joint and interphalangeal joint right thumb. Soft tissue swelling in the fingers, most prominent in the proximal right fourth finger. Scattered superficial densities throughout the third through fifth fingers is probably present on the patient's skin.  IMPRESSION: 1. No fracture or malalignment. 2. Polyarticular moderate erosive osteoarthritis as described. 3. 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 4. Diffuse osteopenia.   Electronically Signed By: Delbert Phenix M.D. On: 03/18/2016 12:55         ED Course: * The patient was initiated on IV antibiotic therapy and I chose vancomycin due to the patient's extensive penicillin allergy. As stated above the ring had been removed in the triage  area and the patient's x-rays do not appear to show any obvious acute injury. I felt the ring is created some vascular damage to the right ring finger and likely necrosis of tissue.    Assessment: *right ring finger tissue necrosis vs infection      Plan:  Transfer to [facility] Emergency department            Jennye Moccasin, MD 03/18/16 1415

## 2016-03-18 NOTE — ED Notes (Addendum)
Patient presents [facility] the ED [facility] from [facility] with pain, redness, and puss to her left hand, specifically her fourth finger of her right hand.  Patient reports getting her hand caught in a door 2 days ago.  Patient currently has a sterling silver ring on injured finger and finger is swollen around the ring.   Patient is in no obvious distress at this time.

## 2016-03-18 NOTE — ED Notes (Signed)
EMs at bedside for transport

## 2016-03-18 NOTE — ED Notes (Signed)
[facility]

## 2016-03-18 NOTE — ED Notes (Signed)
EDP at bedside  

## 2016-07-19 IMAGING — DX DG HAND COMPLETE 3+V*R*
3 series · 3 of 3 positions shown · non-contrast
Comparison: None.

CLINICAL DATA: Right hand injury 2 days ago

EXAM:
RIGHT HAND - COMPLETE 3+ VIEW

[hand ap]
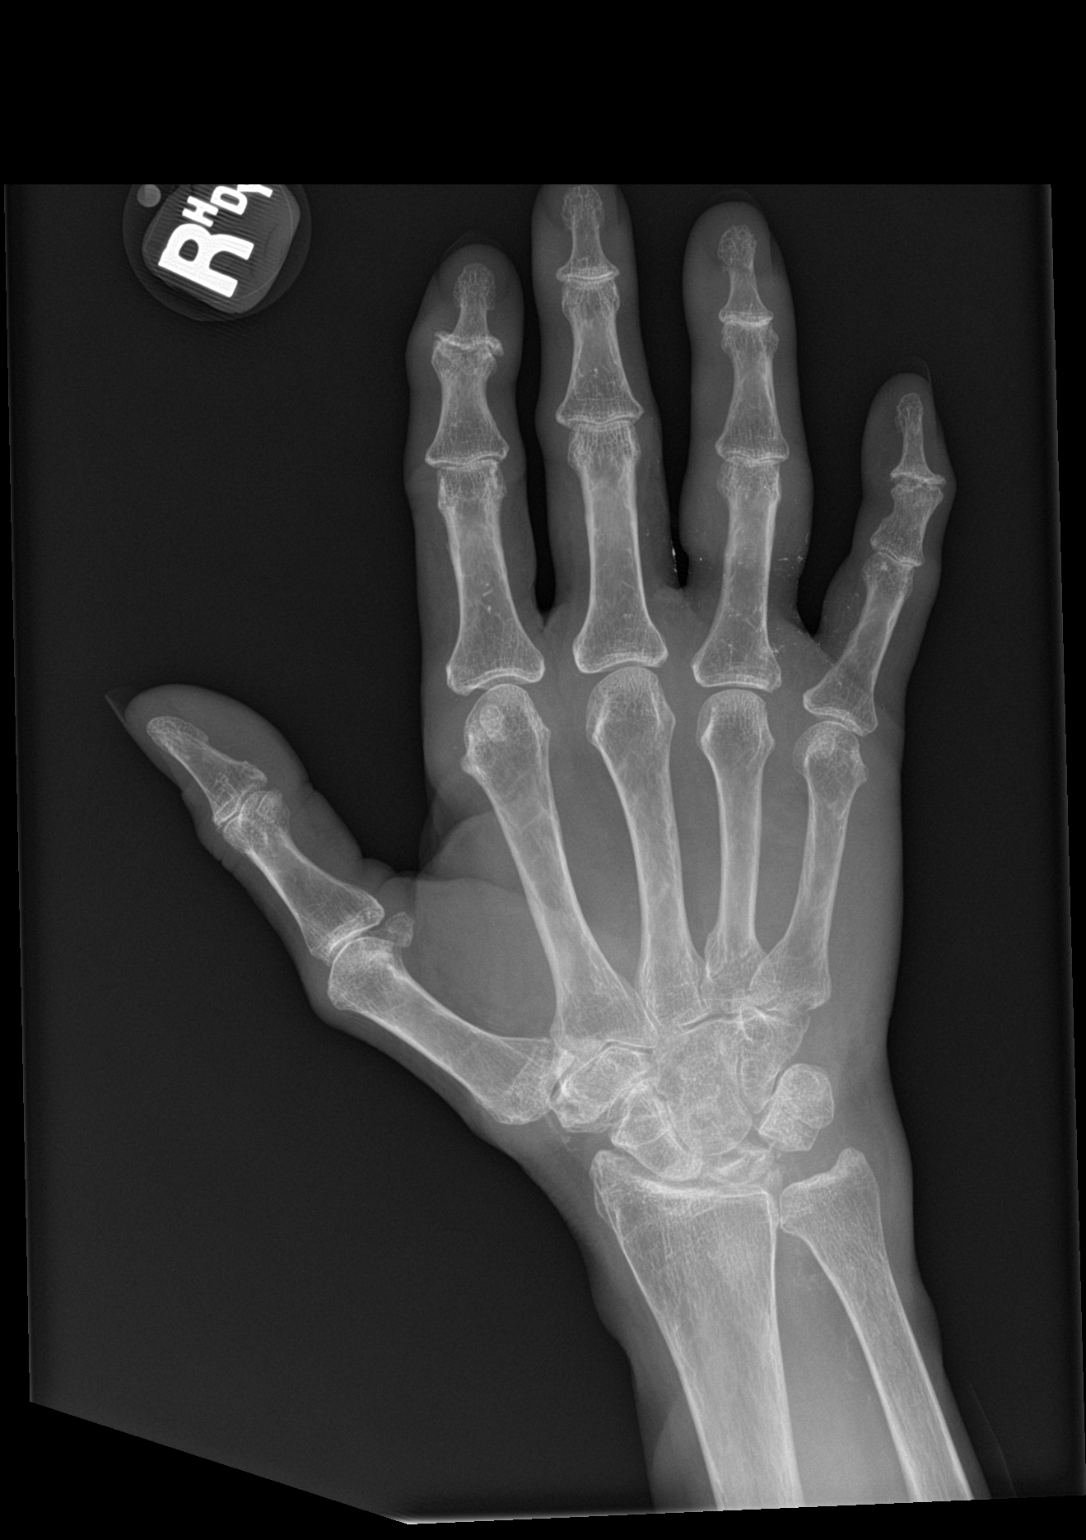

[hand obl]
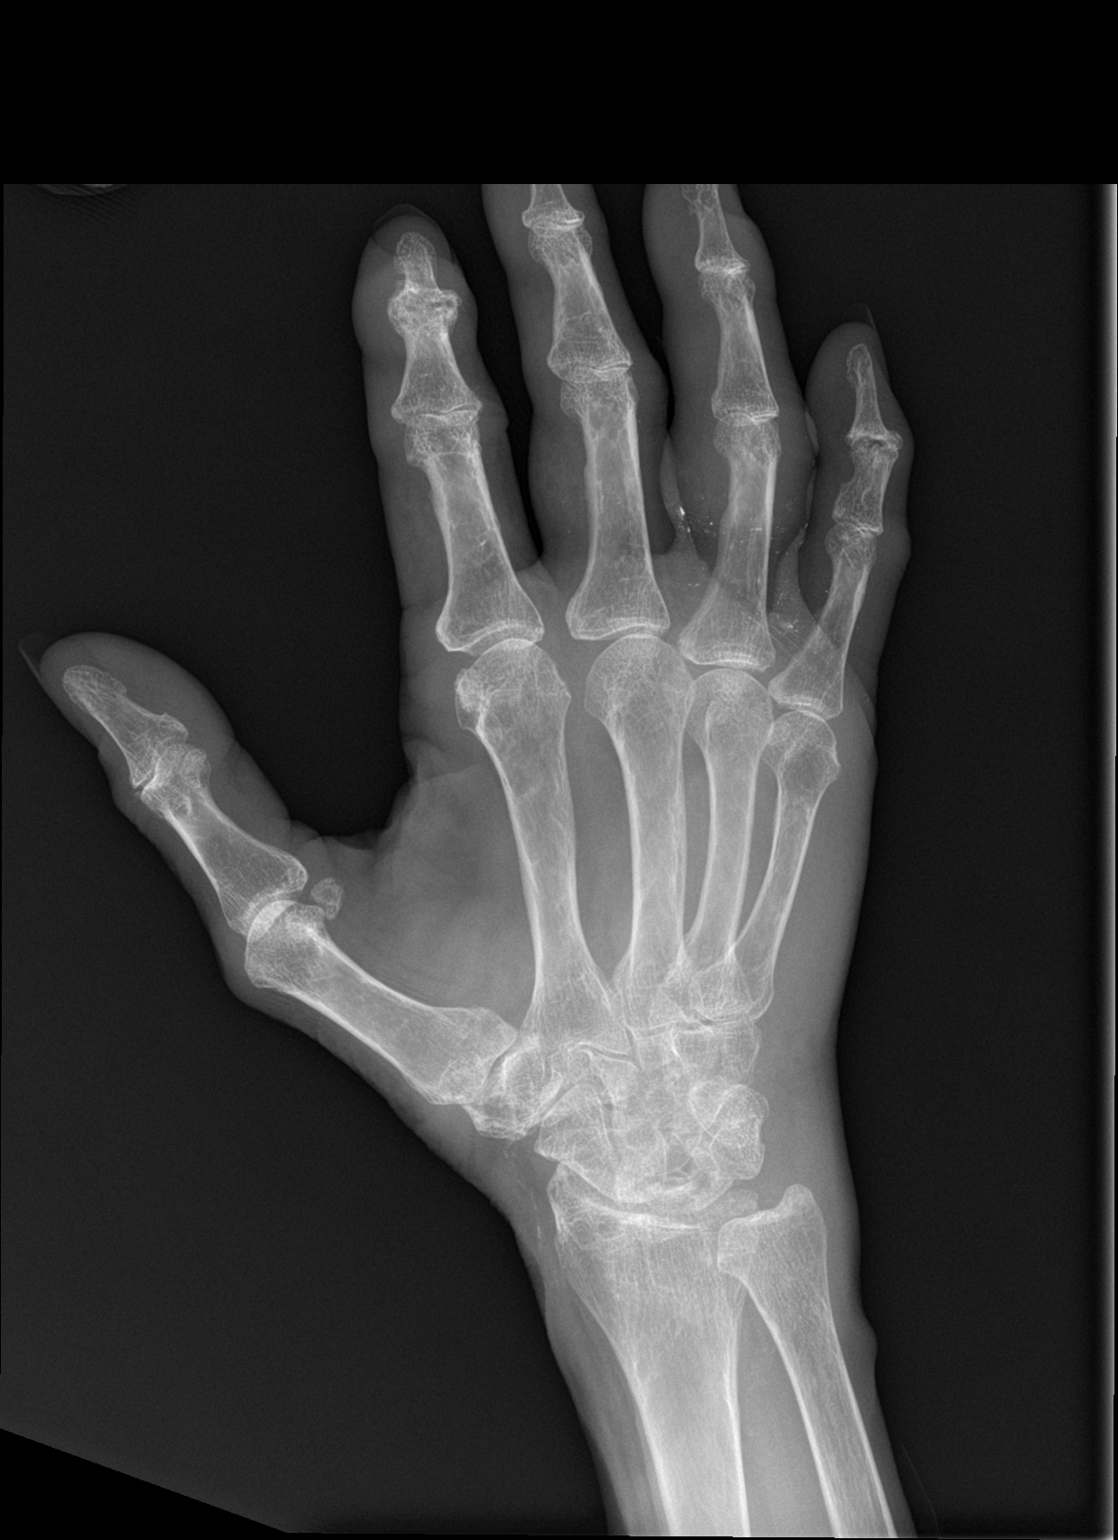

[hand lat]
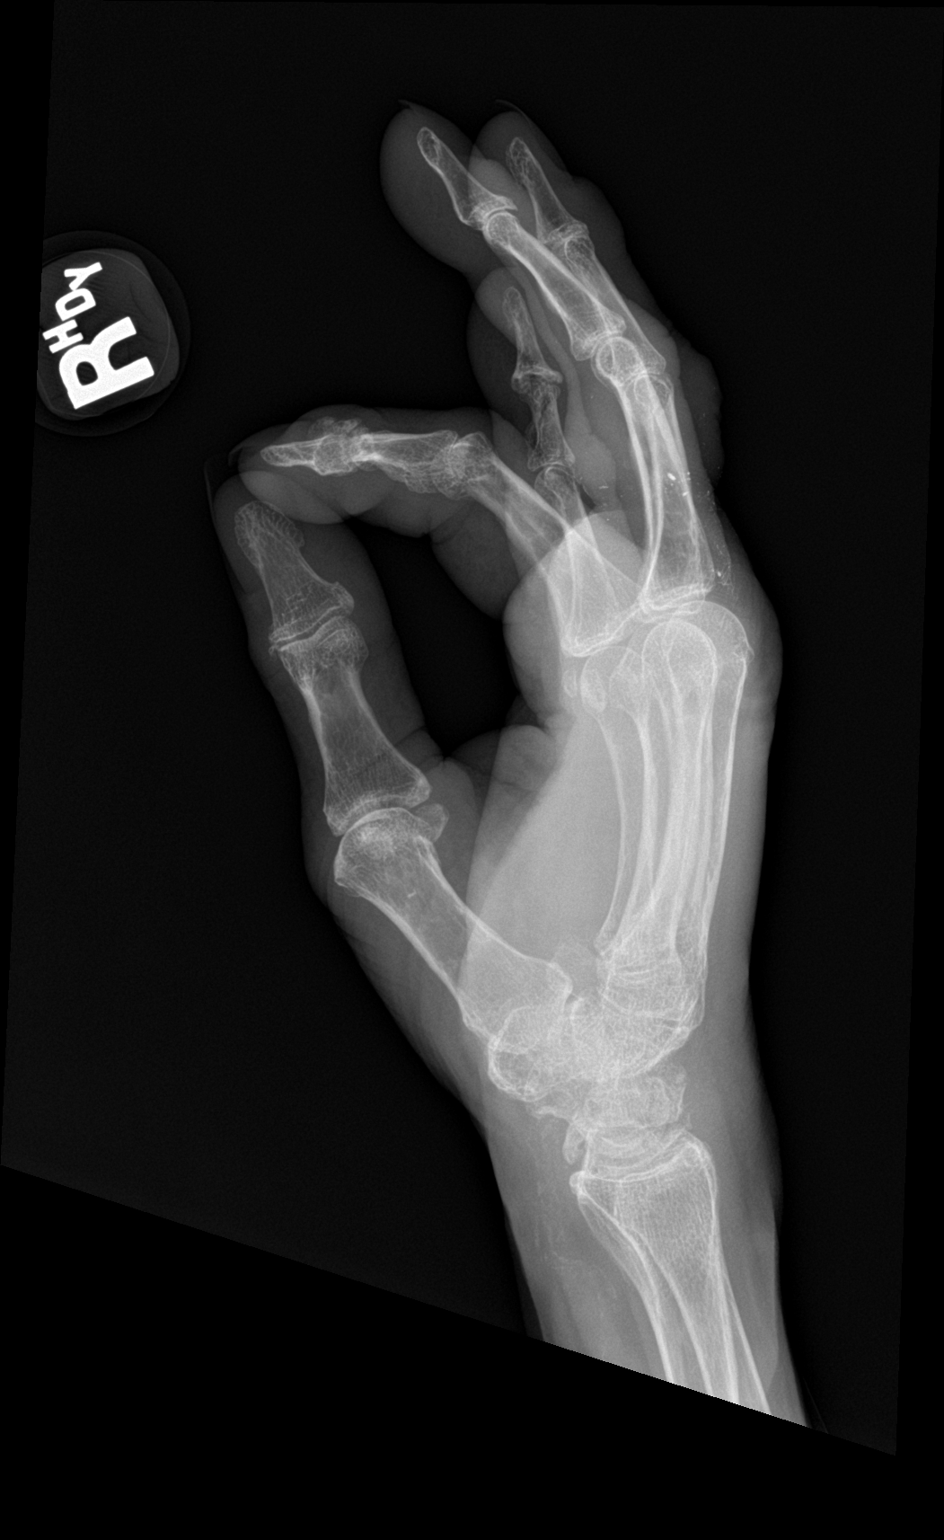

[3 of 3 positions shown; findings below may reference images not displayed]

FINDINGS: No fracture, dislocation or suspicious focal osseous lesion. There
is flattening and fragmentation of the lunate. Diffuse osteopenia.
Erosive osteoarthritis in the distal interphalangeal joints of the
second and fifth fingers. Osteoarthritis in the first
carpometacarpal joint and interphalangeal joint right thumb. Soft
tissue swelling in the fingers, most prominent in the proximal right
fourth finger. Scattered superficial densities throughout the third
through fifth fingers is probably present on the patient's skin.
IMPRESSION: 1. No fracture or malalignment.
2. Polyarticular moderate erosive osteoarthritis as described.
3. Flattening and fragmentation of the lunate is probably due to
avascular necrosis.
4. Diffuse osteopenia.

## 2017-10-11 DEATH — deceased
